# Patient Record
Sex: Male | Born: 1960 | Race: Black or African American | Hispanic: No | Marital: Single | Smoking: Never smoker
Health system: Southern US, Community
[De-identification: ages and names within clinical notes are randomized; demographics above are authoritative.]

## PROBLEM LIST (undated history)

## (undated) ENCOUNTER — Emergency Department (HOSPITAL_COMMUNITY): Discharge status: Absent without leave | Payer: Self-pay

## (undated) DIAGNOSIS — E785 Hyperlipidemia, unspecified: Secondary | ICD-10-CM

## (undated) DIAGNOSIS — I10 Essential (primary) hypertension: Secondary | ICD-10-CM

## (undated) HISTORY — DX: Hyperlipidemia, unspecified: E78.5

---

## 1978-07-06 HISTORY — PX: NASAL SINUS SURGERY: SHX719

## 2006-12-09 ENCOUNTER — Encounter: Admission: RE | Admit: 2006-12-09 | Discharge: 2006-12-09 | Payer: Self-pay | Admitting: Internal Medicine

## 2006-12-09 ENCOUNTER — Ambulatory Visit: Payer: Self-pay | Admitting: *Deleted

## 2006-12-17 ENCOUNTER — Encounter: Admission: RE | Admit: 2006-12-17 | Discharge: 2006-12-17 | Payer: Self-pay | Admitting: Internal Medicine

## 2008-08-22 IMAGING — CT CT HEAD W/O CM
1 series · 15 of 28 positions shown, 19 images · IV contrast (agent unspecified)
Comparison: None.

CLINICAL DATA: Episode of left arm numbness three weeks ago.  Possible stroke.  
 HEAD CT WITHOUT CONTRAST:
TECHNIQUE: Contiguous axial images were obtained from the base of the skull through the vertex according to standard protocol without contrast.

[Series 32: 3d filtered head · axial · 0.49mm/px · z∈[+49,+175]mm · 15 of 28 slices shown, 19 images]
[im 2/28  brain]
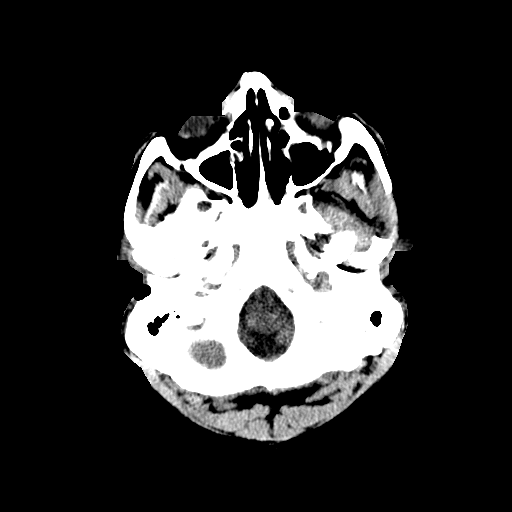
[im 2/28  bone]
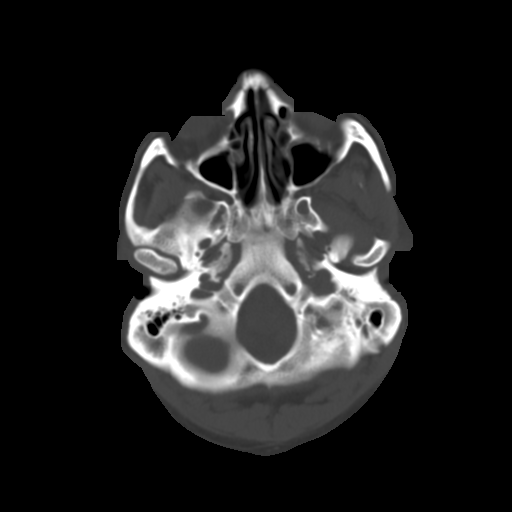
[im 4/28  brain]
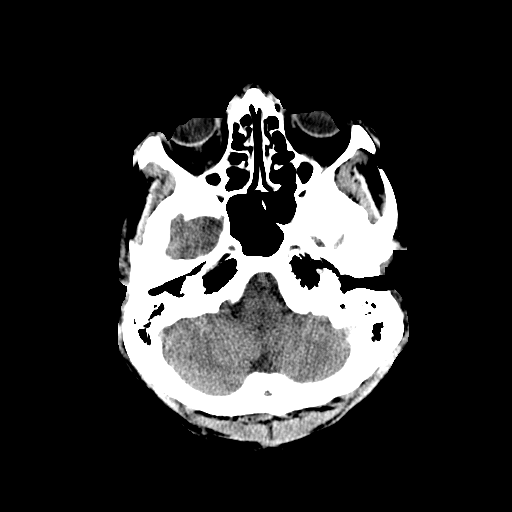
[im 6/28  brain]
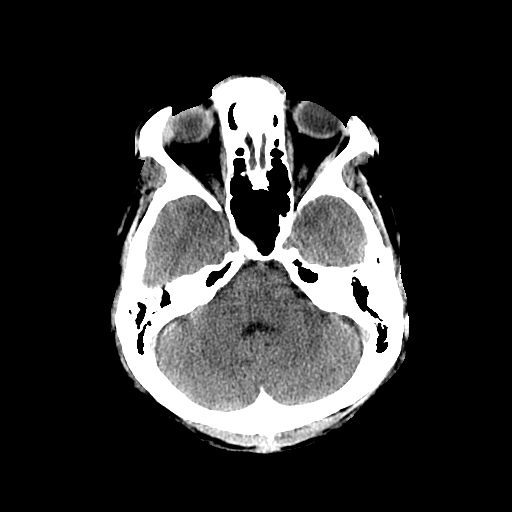
[im 8/28  brain]
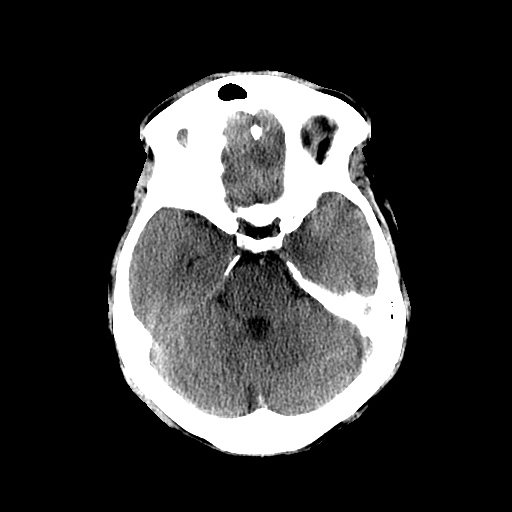
[im 9/28  brain]
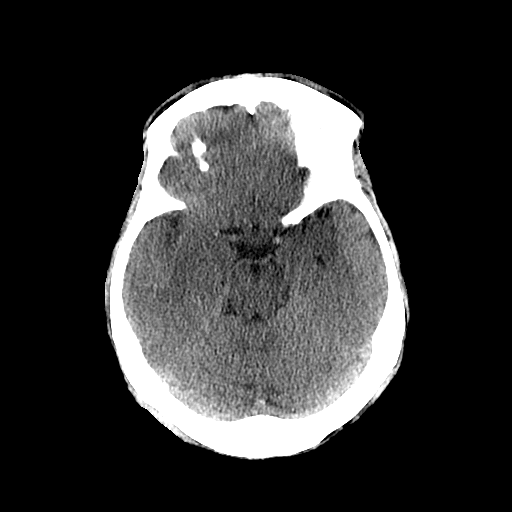
[im 9/28  bone]
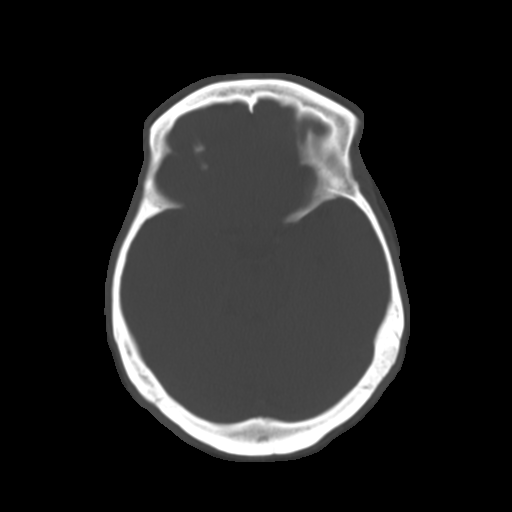
[im 11/28  brain]
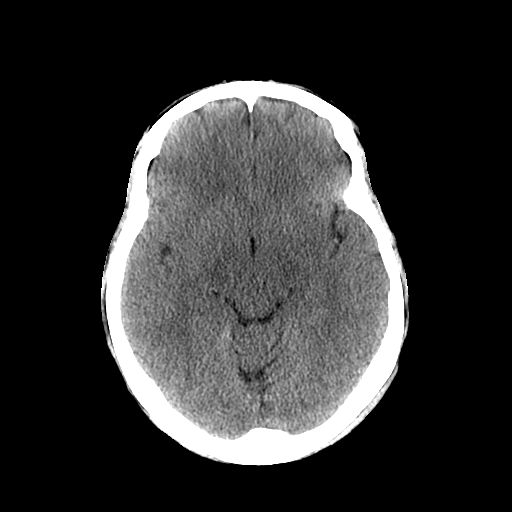
[im 13/28  brain]
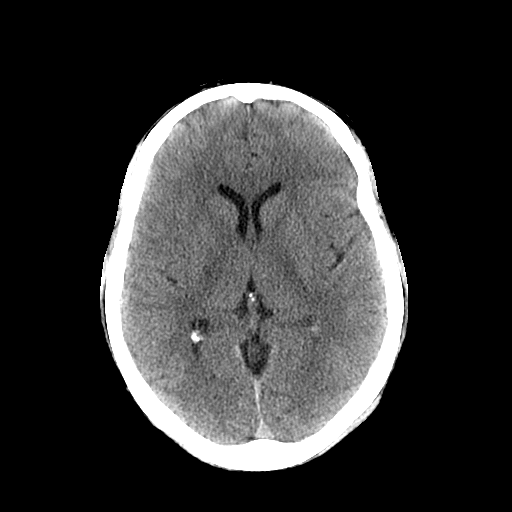
[im 15/28  brain]
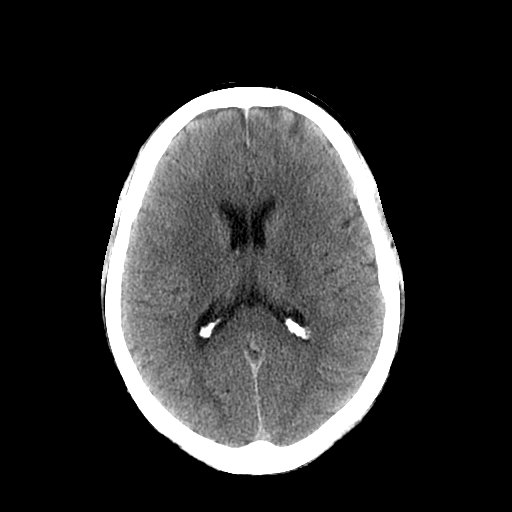
[im 16/28  brain]
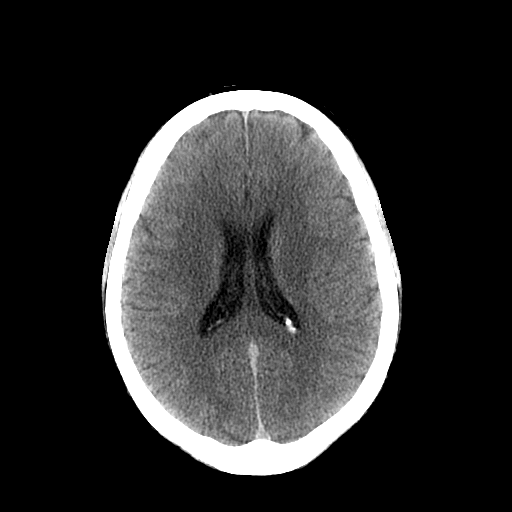
[im 16/28  bone]
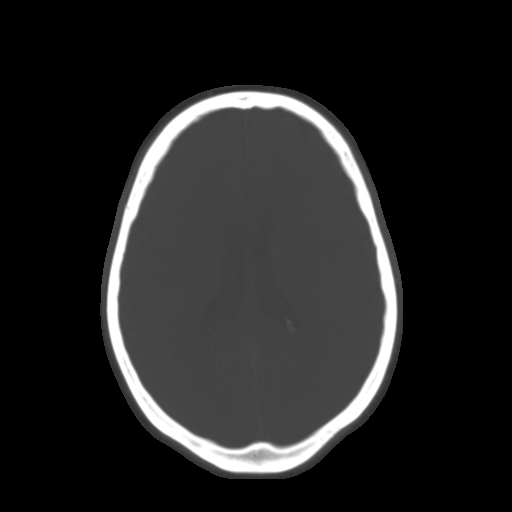
[im 18/28  brain]
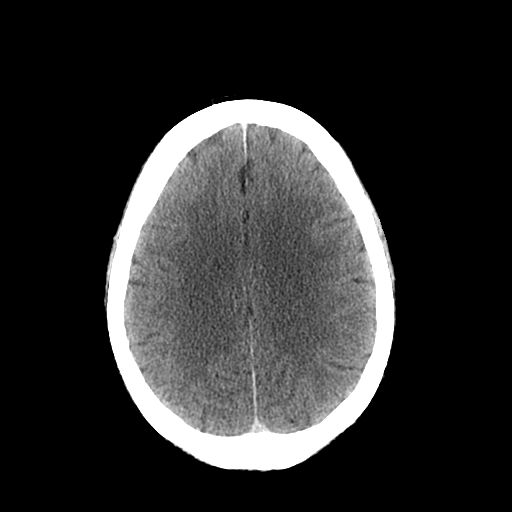
[im 20/28  brain]
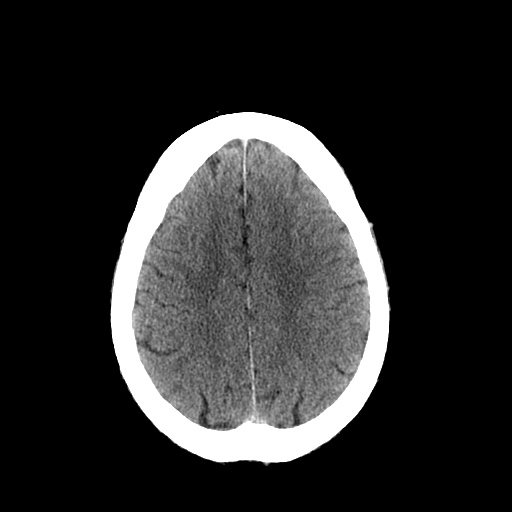
[im 21/28  brain]
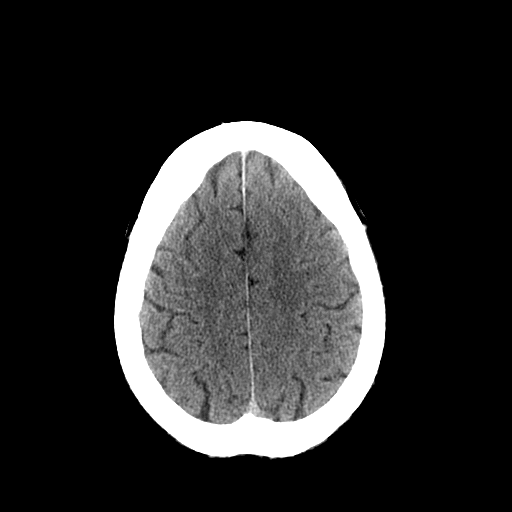
[im 23/28  brain]
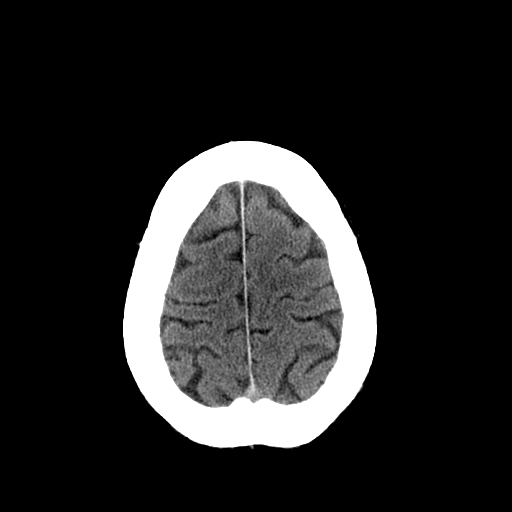
[im 23/28  bone]
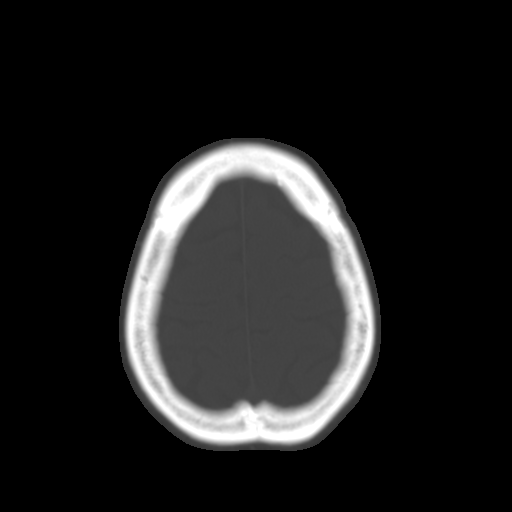
[im 25/28  brain]
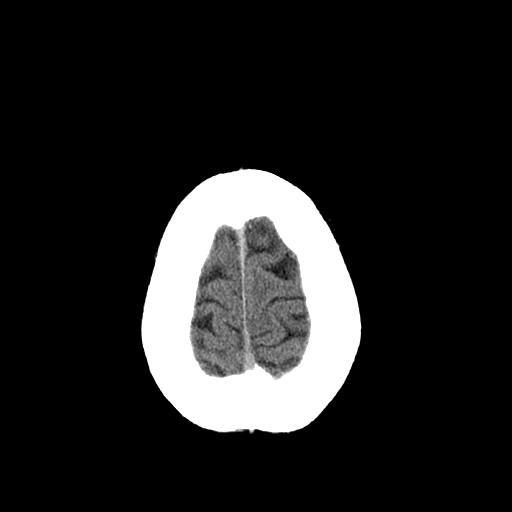
[im 27/28  brain]
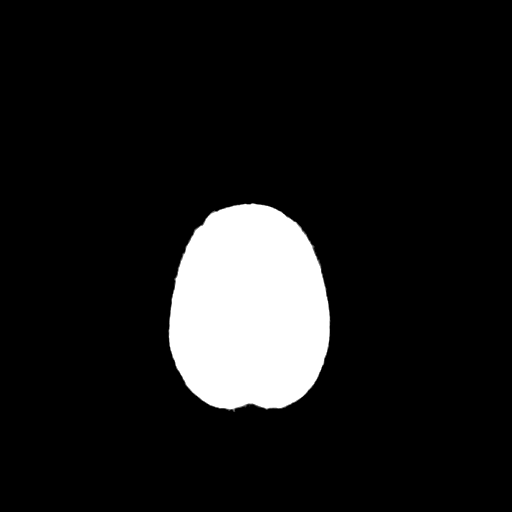

[15 of 28 positions shown; findings below may reference images not displayed]

FINDINGS: There is no evidence of acute intracranial hemorrhage, mass effect, or extraaxial fluid collection.  The ventricles and subarachnoid spaces remain appropriately sized for age.  There is low density in the region of the posterior limb of the internal capsule on the right (image 13) which could reflect a subacute lacunar stroke.  No cortical stroke is demonstrated.  Low density anteriorly in the left aspect of the pons on image 8 is probably artifactual as the pons appears normal on the adjacent images.  There is mild mucosal thickening in the right maxillary and both ethmoid sinuses. The calvarium is intact.
IMPRESSION: 1.  No evidence of acute hemorrhage or cortical stroke.
 2.  Possible small subacute lacunar stroke in the posterior limb of the right internal capsule.  This would be better evaluated by MRI if clinically warranted.

## 2011-08-27 ENCOUNTER — Encounter (HOSPITAL_COMMUNITY): Payer: Self-pay | Admitting: Emergency Medicine

## 2011-08-27 ENCOUNTER — Emergency Department (INDEPENDENT_AMBULATORY_CARE_PROVIDER_SITE_OTHER)
Admission: EM | Admit: 2011-08-27 | Discharge: 2011-08-27 | Disposition: A | Discharge status: Home Health | Payer: Self-pay | Source: Home / Self Care | Attending: Emergency Medicine | Admitting: Emergency Medicine

## 2011-08-27 DIAGNOSIS — L03019 Cellulitis of unspecified finger: Secondary | ICD-10-CM

## 2011-08-27 DIAGNOSIS — Z23 Encounter for immunization: Secondary | ICD-10-CM

## 2011-08-27 DIAGNOSIS — L03012 Cellulitis of left finger: Secondary | ICD-10-CM

## 2011-08-27 HISTORY — DX: Essential (primary) hypertension: I10

## 2011-08-27 MED ORDER — CEPHALEXIN 500 MG PO CAPS: 500.0000 mg | ORAL_CAPSULE | Freq: Three times a day (TID) | ORAL | Status: AC

## 2011-08-27 MED ORDER — TETANUS-DIPHTH-ACELL PERTUSSIS 5-2.5-18.5 LF-MCG/0.5 IM SUSP
INTRAMUSCULAR | Status: AC
  Filled 2011-08-27: qty 0.5

## 2011-08-27 MED ORDER — TETANUS-DIPHTH-ACELL PERTUSSIS 5-2.5-18.5 LF-MCG/0.5 IM SUSP
0.5000 mL | Freq: Once | INTRAMUSCULAR | Status: AC
  Administered 2011-08-27: 0.5 mL via INTRAMUSCULAR

## 2011-08-27 NOTE — Discharge Instructions (Signed)
Paronychia Paronychia is an inflammatory reaction involving the folds of the skin surrounding the fingernail. This is commonly caused by an infection in the skin around a nail. The most common cause of paronychia is frequent wetting of the hands (as seen with bartenders, food servers, nurses or others who wet their hands). This makes the skin around the fingernail susceptible to infection by bacteria (germs) or fungus. Other predisposing factors are:  Aggressive manicuring.   Nail biting.   Thumb sucking.  The most common cause is a staphylococcal (a type of germ) infection, or a fungal (Candida) infection. When caused by a germ, it usually comes on suddenly with redness, swelling, pus and is often painful. It may get under the nail and form an abscess (collection of pus), or form an abscess around the nail. If the nail itself is infected with a fungus, the treatment is usually prolonged and may require oral medicine for up to one year. Your caregiver will determine the length of time treatment is required. The paronychia caused by bacteria (germs) may largely be avoided by not pulling on hangnails or picking at cuticles. When the infection occurs at the tips of the finger it is called felon. When the cause of paronychia is from the herpes simplex virus (HSV) it is called herpetic whitlow. TREATMENT  When an abscess is present treatment is often incision and drainage. This means that the abscess must be cut open so the pus can get out. When this is done, the following home care instructions should be followed. HOME CARE INSTRUCTIONS   It is important to keep the affected fingers very dry. Rubber or plastic gloves over cotton gloves should be used whenever the hand must be placed in water.   Keep wound clean, dry and dressed as suggested by your caregiver between warm soaks or warm compresses.   Soak in warm water for fifteen to twenty minutes three to four times per day for bacterial infections.  Fungal infections are very difficult to treat, so often require treatment for long periods of time.   For bacterial (germ) infections take antibiotics (medicine which kill germs) as directed and finish the prescription, even if the problem appears to be solved before the medicine is gone.   Only take over-the-counter or prescription medicines for pain, discomfort, or fever as directed by your caregiver.  SEEK IMMEDIATE MEDICAL CARE IF:  You have redness, swelling, or increasing pain in the wound.   You notice pus coming from the wound.   You have a fever.   You notice a bad smell coming from the wound or dressing.  Document Released: 12/16/2000 Document Revised: 03/04/2011 Document Reviewed: 08/17/2008 [employer] Patient Information 2012 [employer]. 

## 2011-08-27 NOTE — ED Notes (Signed)
HERE WITH LEFT HAND THUMB THROB PAIN X 3 DYS AGO S/P INJURY X 1WEEK AGO WHILE MOVING SOMETHING.PT STATES STAPLE WENT DOWN IN TIP OF THUMB BUT ABLE TO PULL IT OUT.MIN SWELLING  SEEN BUT BENDING AND MOVING WITHOUT DIFF.PT DON'T REMEMBER LAST TETANUS

## 2011-08-27 NOTE — ED Provider Notes (Signed)
Chief Complaint  Patient presents with  . Finger Injury    History of Present Illness:   About a week ago, Andre Salazar stuck a staple under his left thumbnail. This was a little bit painful at first but then it got better, however over the last several days the tip of the thumb has been painful, throbbing, and tender to touch. There's been no purulent drainage. He is able to move all his joints well and without any difficulty. He cannot recall when his last tetanus vaccine was.  Review of Systems:  Other than noted above, the patient denies any of the following symptoms: Systemic:  No fevers, chills, sweats, or aches.  No fatigue or tiredness. Musculoskeletal:  No joint pain, arthritis, bursitis, swelling, back pain, or neck pain. Neurological:  No muscular weakness, paresthesias, headache, or trouble with speech or coordination.  No dizziness.   PMFSH:  Past medical history, family history, social history, meds, and allergies were reviewed.  Physical Exam:   Vital signs:  BP 132/82  Pulse 87  Temp(Src) 99 F (37.2 C) (Oral)  Resp 24  SpO2 98% Gen:  Alert and oriented times 3.  In no distress. Musculoskeletal: He has a paronychia with swelling, tenderness to touch, and the visible pus collection under the nail fold of the left thumb medially. Otherwise, all joints had a full a ROM with no swelling, bruising or deformity.  No edema, pulses full. Extremities were warm and pink.  Capillary refill was brisk.  Skin:  Clear, warm and dry.  No rash. Neuro:  Alert and oriented times 3.  Muscle strength was normal.  Sensation was intact to light touch.   Procedure Note:  Verbal informed consent was obtained from the patient.  Risks and benefits were outlined with the patient.  Patient understands and accepts these risks.  Identity of the patient was confirmed verbally and by armband.    Procedure was performed as followed:  The area of the paronychia was prepped with alcohol and a small  incision was made into the collection of pus yielding several drops of pus. This was cultured, all the pus was drained out and a pressure dressing was applied. The patient was instructed in wound care and should return again if needed.  Patient tolerated the procedure well without any immediate complications.   Assessment:   Diagnoses that have been ruled out:  None  Diagnoses that are still under consideration:  None  Final diagnoses:  Paronychia of left thumb    Plan:   1.  The following meds were prescribed:   New Prescriptions   CEPHALEXIN (KEFLEX) 500 MG CAPSULE    Take 1 capsule (500 mg total) by mouth 3 (three) times daily.   2.  The patient was instructed in symptomatic care, including rest and activity, elevation, application of ice and compression.  Appropriate handouts were given. 3.  The patient was told to return if becoming worse in any way, if no better in 3 or 4 days, and given some red flag symptoms that would indicate earlier return.   4.  The patient was told to follow up only if needed, particularly if there is any evidence that the infection is progressing.   Roque Lias, MD 08/27/11 [phone]

## 2011-08-28 ENCOUNTER — Telehealth (HOSPITAL_COMMUNITY): Payer: Self-pay | Admitting: *Deleted

## 2011-08-28 NOTE — ED Notes (Signed)
Dr. Lorenz Coaster said he had e-prescribed it yesterday, but if they don't have it I can call it in. I called the [facility] pharmacy back and spoke to the tech. She said they had it filled " from 2 days ago." I told her to disregard my call in order today on their VM if it was already filled. I called pt. and told left him a message that the Rx. was filled and ready to be picked up. Vassie Moselle 08/28/2011

## 2011-08-28 NOTE — ED Notes (Signed)
[phone] Pt. called and said [facility] on [address] did not have his Rx. of Keflex. I called [facility]  [phone] but could not get a pharmacist or tech to answer. I called in Rx. to VM but said to disregard if Rx. has already been filled. Vassie Moselle 08/28/2011

## 2011-08-30 LAB — CULTURE, ROUTINE-ABSCESS: Gram Stain: NONE SEEN

## 2013-07-06 HISTORY — PX: COLONOSCOPY: SHX174

## 2013-07-11 ENCOUNTER — Other Ambulatory Visit: Payer: Self-pay | Admitting: Gastroenterology

## 2013-07-11 DIAGNOSIS — R7989 Other specified abnormal findings of blood chemistry: Secondary | ICD-10-CM

## 2013-07-11 DIAGNOSIS — R945 Abnormal results of liver function studies: Principal | ICD-10-CM

## 2014-01-03 ENCOUNTER — Other Ambulatory Visit: Payer: Self-pay | Admitting: Family Medicine

## 2014-01-03 ENCOUNTER — Ambulatory Visit (INDEPENDENT_AMBULATORY_CARE_PROVIDER_SITE_OTHER): Payer: Self-pay

## 2014-01-03 DIAGNOSIS — R9389 Abnormal findings on diagnostic imaging of other specified body structures: Secondary | ICD-10-CM

## 2014-01-03 DIAGNOSIS — R52 Pain, unspecified: Secondary | ICD-10-CM

## 2016-06-03 DIAGNOSIS — M791 Myalgia: Secondary | ICD-10-CM | POA: Diagnosis not present

## 2016-06-03 DIAGNOSIS — M25562 Pain in left knee: Secondary | ICD-10-CM | POA: Diagnosis not present

## 2016-06-03 DIAGNOSIS — M25552 Pain in left hip: Secondary | ICD-10-CM | POA: Diagnosis not present

## 2016-06-03 DIAGNOSIS — Z23 Encounter for immunization: Secondary | ICD-10-CM | POA: Diagnosis not present

## 2016-06-03 DIAGNOSIS — R1032 Left lower quadrant pain: Secondary | ICD-10-CM | POA: Diagnosis not present

## 2016-06-03 DIAGNOSIS — M25551 Pain in right hip: Secondary | ICD-10-CM | POA: Diagnosis not present

## 2016-06-03 DIAGNOSIS — M5136 Other intervertebral disc degeneration, lumbar region: Secondary | ICD-10-CM | POA: Diagnosis not present

## 2016-06-03 DIAGNOSIS — M179 Osteoarthritis of knee, unspecified: Secondary | ICD-10-CM | POA: Diagnosis not present

## 2016-06-04 DIAGNOSIS — M25562 Pain in left knee: Secondary | ICD-10-CM | POA: Diagnosis not present

## 2016-06-04 DIAGNOSIS — M1990 Unspecified osteoarthritis, unspecified site: Secondary | ICD-10-CM | POA: Diagnosis not present

## 2016-06-18 DIAGNOSIS — M25562 Pain in left knee: Secondary | ICD-10-CM | POA: Diagnosis not present

## 2016-06-18 DIAGNOSIS — M1990 Unspecified osteoarthritis, unspecified site: Secondary | ICD-10-CM | POA: Diagnosis not present

## 2016-07-14 DIAGNOSIS — M25552 Pain in left hip: Secondary | ICD-10-CM | POA: Diagnosis not present

## 2016-07-27 DIAGNOSIS — M1612 Unilateral primary osteoarthritis, left hip: Secondary | ICD-10-CM | POA: Diagnosis not present

## 2016-07-27 DIAGNOSIS — M5136 Other intervertebral disc degeneration, lumbar region: Secondary | ICD-10-CM | POA: Diagnosis not present

## 2016-09-28 DIAGNOSIS — M5136 Other intervertebral disc degeneration, lumbar region: Secondary | ICD-10-CM | POA: Diagnosis not present

## 2016-09-30 DIAGNOSIS — M5136 Other intervertebral disc degeneration, lumbar region: Secondary | ICD-10-CM | POA: Diagnosis not present

## 2016-10-07 DIAGNOSIS — M5136 Other intervertebral disc degeneration, lumbar region: Secondary | ICD-10-CM | POA: Diagnosis not present

## 2016-11-24 ENCOUNTER — Other Ambulatory Visit: Payer: Self-pay | Admitting: Internal Medicine

## 2016-11-24 ENCOUNTER — Ambulatory Visit
Admission: RE | Admit: 2016-11-24 | Discharge: 2016-11-24 | Disposition: A | Discharge status: Home / Self Care | Payer: BLUE CROSS/BLUE SHIELD | Source: Ambulatory Visit | Attending: Internal Medicine | Admitting: Internal Medicine

## 2016-11-24 DIAGNOSIS — R29810 Facial weakness: Secondary | ICD-10-CM

## 2017-04-08 DIAGNOSIS — Z0000 Encounter for general adult medical examination without abnormal findings: Secondary | ICD-10-CM | POA: Diagnosis not present

## 2017-04-08 DIAGNOSIS — I10 Essential (primary) hypertension: Secondary | ICD-10-CM | POA: Diagnosis not present

## 2017-04-15 DIAGNOSIS — R002 Palpitations: Secondary | ICD-10-CM | POA: Diagnosis not present

## 2017-04-15 DIAGNOSIS — E782 Mixed hyperlipidemia: Secondary | ICD-10-CM | POA: Diagnosis not present

## 2017-04-15 DIAGNOSIS — E6609 Other obesity due to excess calories: Secondary | ICD-10-CM | POA: Diagnosis not present

## 2017-04-15 DIAGNOSIS — I10 Essential (primary) hypertension: Secondary | ICD-10-CM | POA: Diagnosis not present

## 2017-04-15 DIAGNOSIS — Z0000 Encounter for general adult medical examination without abnormal findings: Secondary | ICD-10-CM | POA: Diagnosis not present

## 2017-04-28 DIAGNOSIS — M5432 Sciatica, left side: Secondary | ICD-10-CM | POA: Diagnosis not present

## 2017-04-28 DIAGNOSIS — M5136 Other intervertebral disc degeneration, lumbar region: Secondary | ICD-10-CM | POA: Diagnosis not present

## 2017-06-03 DIAGNOSIS — R945 Abnormal results of liver function studies: Secondary | ICD-10-CM | POA: Diagnosis not present

## 2017-06-03 DIAGNOSIS — E782 Mixed hyperlipidemia: Secondary | ICD-10-CM | POA: Diagnosis not present

## 2017-06-10 DIAGNOSIS — R7303 Prediabetes: Secondary | ICD-10-CM | POA: Diagnosis not present

## 2017-06-10 DIAGNOSIS — I10 Essential (primary) hypertension: Secondary | ICD-10-CM | POA: Diagnosis not present

## 2017-06-10 DIAGNOSIS — M5442 Lumbago with sciatica, left side: Secondary | ICD-10-CM | POA: Diagnosis not present

## 2017-06-10 DIAGNOSIS — M47816 Spondylosis without myelopathy or radiculopathy, lumbar region: Secondary | ICD-10-CM | POA: Diagnosis not present

## 2017-06-10 DIAGNOSIS — G4733 Obstructive sleep apnea (adult) (pediatric): Secondary | ICD-10-CM | POA: Diagnosis not present

## 2017-06-10 DIAGNOSIS — M25562 Pain in left knee: Secondary | ICD-10-CM | POA: Diagnosis not present

## 2017-06-15 ENCOUNTER — Other Ambulatory Visit: Payer: Self-pay | Admitting: Internal Medicine

## 2017-06-15 DIAGNOSIS — M5442 Lumbago with sciatica, left side: Secondary | ICD-10-CM

## 2017-06-18 DIAGNOSIS — M1712 Unilateral primary osteoarthritis, left knee: Secondary | ICD-10-CM | POA: Diagnosis not present

## 2017-06-22 ENCOUNTER — Encounter: Payer: Self-pay | Admitting: Neurology

## 2017-06-23 ENCOUNTER — Encounter: Payer: Self-pay | Admitting: Neurology

## 2017-06-23 ENCOUNTER — Encounter (INDEPENDENT_AMBULATORY_CARE_PROVIDER_SITE_OTHER): Payer: Self-pay

## 2017-06-23 ENCOUNTER — Ambulatory Visit: Payer: BLUE CROSS/BLUE SHIELD | Admitting: Neurology

## 2017-06-23 VITALS — BP 139/79 | HR 75 | Ht 71.0 in | Wt 251.0 lb

## 2017-06-23 DIAGNOSIS — R0683 Snoring: Secondary | ICD-10-CM

## 2017-06-23 DIAGNOSIS — G4730 Sleep apnea, unspecified: Secondary | ICD-10-CM | POA: Diagnosis not present

## 2017-06-23 DIAGNOSIS — I10 Essential (primary) hypertension: Secondary | ICD-10-CM | POA: Diagnosis not present

## 2017-06-23 DIAGNOSIS — G4710 Hypersomnia, unspecified: Secondary | ICD-10-CM | POA: Diagnosis not present

## 2017-06-23 NOTE — Progress Notes (Signed)
SLEEP MEDICINE CLINIC   Provider:  Melvyn Novasarmen  [place], [place]  Primary Care Physician:  Georgianne Fickamachandran, Ajith, MD   Referring Provider: Georgianne Fickamachandran, Ajith, MD   Chief Complaint  Patient presents with  . New Patient (Initial Visit)    pt alone, rm 10. pt has been told he stops breathing and snoring.. he had operation for a deviated septum  in past. pt has also been tried during the day.     HPI:  Andre Salazar is a 56 y.o. male , seen here  in a referral  from Dr. Nicholos Johnsamachandran for evaluation of sleep apnea.   Andre Salazar is a 56 year old male patient, left-handed, and has felt more and more tired and fatigued during the day.  He reports that he struggles with sleepiness as well especially when he is physically inactive, in a meeting for example.  As long as he is mentally stimulated and physically active his sleepiness is controlled.   His fiance has shared with him that he snores and she has witnessed him to stop breathing at night.  Over 20 years ago he had a septoplasty which allows him to keep the nasal passage patent. He stills is often congested.  He thinks that he was even 20 years ago snoring but at the time he was mostly a mouth breather prior to his nasal surgery. The patient has made a conscious effort to lose weight and reduce his body weight by about 12 pounds over the last half year.  He had gained weight after being physically an active following an Achilles tendon surgery.  Sleep habits are as follows: The patient states that his days are unpredictable, he starts usually winding down around 10 PM but that still puts his bedtime towards midnight.  He has no trouble initiating sleep, he describes his bedroom as cool, quiet and dark.  Prefers to sleep on his side, multiple pillows. He will have one bathroom break on average at night, sometimes two but can go back to sleep after each. He does not recall dreaming, rises spontaneously between 7.30 and 8 AM.  When he wakes up  he does not feel headaches, dizziness, diaphoresis or palpitations.  He he feels that he is not fully rested and refreshed.He usually does not take daytime naps.   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  Irregular work hours not night shift work. The patient is engaged, his fiance has noted an increase in snoring and apnea over the last years. 2 adult sons, non smoker all his life, ETOH - socially, wine or beer. 4 a week.  Caffeine, none.   Review of Systems: Out of a complete 14 system review, the patient complains of only the following symptoms, and all other reviewed systems are negative.  How likely are you to doze in the following situations: 0 = not likely, 1 = slight chance, 2 = moderate chance, 3 = high chance  Sitting and Reading? 2 Watching Television?  2 Sitting inactive in a public place (theater or meeting)? 1 Lying down in the afternoon when circumstances permit?3 Sitting and talking to someone?3 Sitting quietly after lunch without alcohol? 1In a car, while stopped for a few minutes in traffic?2 As a passenger in a car for an hour without a break? 1  Total = 15  Epworth score  15 , Fatigue severity score 37  , depression score n/a    Social History  Socioeconomic History  . Marital status: Married    Spouse name: Not on file  . Number of children: Not on file  . Years of education: Not on file  . Highest education level: Not on file  Social Needs  . Financial resource strain: Not on file  . Food insecurity - worry: Not on file  . Food insecurity - inability: Not on file  . Transportation needs - medical: Not on file  . Transportation needs - non-medical: Not on file  Occupational History  . Not on file  Tobacco Use  . Smoking status: Never Smoker  . Smokeless tobacco: Never Used  Substance and Sexual Activity  . Alcohol use: Yes  . Drug use: No  . Sexual activity: Not on  file  Other Topics Concern  . Not on file  Social History Narrative  . Not on file    Family History  Problem Relation Age of Onset  . Hypertension Other   . Cirrhosis Father   . Alcohol abuse Father     Past Medical History:  Diagnosis Date  . Hyperlipidemia   . Hypertension     Past Surgical History:  Procedure Laterality Date  . COLONOSCOPY  2015  . NASAL SINUS SURGERY  1980    Current Outpatient Medications  Medication Sig Dispense Refill  . cholecalciferol (VITAMIN D) 1000 units tablet Take 1,000 Units by mouth daily.    . Cyanocobalamin (VITAMIN B 12) 100 MCG LOZG Take 1 lozenge by mouth daily.    . fluticasone (FLONASE) 50 MCG/ACT nasal spray Place into both nostrils daily.    . Multiple Vitamin (MULTIVITAMIN) tablet Take 1 tablet by mouth daily.    Marland Kitchen. triamterene-hydrochlorothiazide (DYAZIDE) 37.5-25 MG capsule Take 1 capsule by mouth daily.     No current facility-administered medications for this visit.     Allergies as of 06/23/2017 - Review Complete 06/23/2017  Allergen Reaction Noted  . Iodine  06/22/2017    Vitals: BP 139/79   Pulse 75   Ht 5\' 11"  (1.803 m)   Wt 251 lb (113.9 kg)   BMI 35.01 kg/m  Last Weight:  Wt Readings from Last 1 Encounters:  06/23/17 251 lb (113.9 kg)   JXB:JYNWBMI:Body mass index is 35.01 kg/m.     Last Height:   Ht Readings from Last 1 Encounters:  06/23/17 5\' 11"  (1.803 m)    Physical exam: General: The patient is awake, alert and appears not in acute distress. The patient is well groomed. Head: Normocephalic, atraumatic. Neck is supple. Mallampati 5 ,  neck circumference:18. 5 ". Nasal airflow congested , TMJ is evident . Retrognathia is seen.  Cardiovascular:  Regular rate and rhythm - without  murmurs or carotid bruit, and without distended neck veins. Respiratory: Lungs are clear to auscultation. Skin:  Without evidence of edema, or rash Trunk: BMI is 35. The patient's posture is   Neurologic exam : The patient is  awake and alert, oriented to place and time.  Attention span & concentration ability appears normal.  Speech is fluent,  without dysarthria, dysphonia or aphasia.  Mood and affect are appropriate.  Cranial nerves: Pupils are equal and briskly reactive to light. Funduscopic exam without evidence of pallor or edema. Extraocular movements  in vertical and horizontal planes intact and without nystagmus. Visual fields by finger perimetry are intact. Hearing to finger rub intact.  Facial sensation intact to fine touch. Facial motor strength is symmetric and tongue and uvula move midline. Shoulder shrug was  symmetrical.   Motor exam:  Normal tone, muscle bulk and symmetric strength in all extremities. Sensory:  Fine touch, pinprick and vibration were tested in all extremities. Proprioception tested in the upper extremities was normal. Coordination: Rapid alternating movements/Finger-to-nose maneuver normal without evidence of ataxia, dysmetria or tremor. Gait and station: Patient walks without assistive device . Strength within normal limits.  Stance is stable and normal.  Deep tendon reflexes: in the upper and lower extremities are symmetric and intact.  Babinski maneuver response is downgoing.  Assessment:  After physical and neurologic examination, review of laboratory studies,  Personal review of imaging studies, reports of other /same  Imaging studies, results of polysomnography and / or neurophysiology testing and pre-existing records as far as provided in visit., my assessment is   1) Andre Salazar has some risk factors for sleep apnea and of course that has been the observation that he snores and is apneic at night.  His risk factors include a body mass index that straddles 35, a larger than average neck circumference, mild red prognathia and a high-grade Mallampati.  Helpful should be that he had his septal surgery and that his nasal passages patent, that he is in the process of losing weight,  and that he apparently has not developed nocturia which is common in patients with severe sleep apnea. My goal is to evaluate him by a home sleep test, we can then treat apnea according to the results of this test either with a dental device, with CPAP if necessary, or in case of very mild apnea, just continue the weight loss therapy.  The patient was advised of the nature of the diagnosed disorder , the treatment options and the  risks for general health and wellness arising from not treating the condition.   I spent more than 35  minutes of face to face time with the patient.  Greater than 50% of time was spent in counseling and coordination of care. We have discussed the diagnosis and differential and I answered the patient's questions.    Plan:  Treatment plan and additional workup :  Watch pat HST and follow with auto titration if indicated. RV after HST.      No Follow-up on file.   Melvyn Novas, MD 06/23/2017, 9:20 AM  Certified in Neurology by ABPN Certified in Sleep Medicine by [facility] [address], [address] [place], [place] [address]

## 2017-06-25 ENCOUNTER — Ambulatory Visit
Admission: RE | Admit: 2017-06-25 | Discharge: 2017-06-25 | Disposition: A | Discharge status: Home / Self Care | Payer: BLUE CROSS/BLUE SHIELD | Source: Ambulatory Visit | Attending: Internal Medicine | Admitting: Internal Medicine

## 2017-06-25 DIAGNOSIS — M48061 Spinal stenosis, lumbar region without neurogenic claudication: Secondary | ICD-10-CM | POA: Diagnosis not present

## 2017-06-25 DIAGNOSIS — M5442 Lumbago with sciatica, left side: Secondary | ICD-10-CM

## 2017-07-14 ENCOUNTER — Ambulatory Visit (INDEPENDENT_AMBULATORY_CARE_PROVIDER_SITE_OTHER): Payer: BLUE CROSS/BLUE SHIELD | Admitting: Neurology

## 2017-07-14 DIAGNOSIS — R0683 Snoring: Secondary | ICD-10-CM

## 2017-07-14 DIAGNOSIS — G4730 Sleep apnea, unspecified: Principal | ICD-10-CM

## 2017-07-14 DIAGNOSIS — I10 Essential (primary) hypertension: Secondary | ICD-10-CM

## 2017-07-14 DIAGNOSIS — G4710 Hypersomnia, unspecified: Secondary | ICD-10-CM

## 2017-07-19 ENCOUNTER — Telehealth: Payer: Self-pay | Admitting: Neurology

## 2017-07-19 ENCOUNTER — Other Ambulatory Visit: Payer: Self-pay | Admitting: Neurology

## 2017-07-19 DIAGNOSIS — G4734 Idiopathic sleep related nonobstructive alveolar hypoventilation: Secondary | ICD-10-CM

## 2017-07-19 DIAGNOSIS — G4733 Obstructive sleep apnea (adult) (pediatric): Secondary | ICD-10-CM

## 2017-07-19 DIAGNOSIS — R0683 Snoring: Secondary | ICD-10-CM

## 2017-07-19 NOTE — Procedures (Signed)
[facility] [address] [place], [place] [id number] NAME:  Johnston EbbsStanley T. Nuccio                                                       DOB: [birth year] MEDICAL RECORD No:  [id number]                                                 DOS: 07/15/2017 REFERRING PHYSICIAN: Georgianne FickAjith Ramachandran, M.D. STUDY PERFORMED: Home Sleep Test on apnea link   HISTORY:  Duffy RhodyStanley T. Ericka PontiffMontgomery is a 57 year old male patient, left-handed, and has felt more and more tired and fatigued during the day.  He reports that he struggles with sleepiness when he is physically inactive, in a meeting, for example.  As long as he is mentally stimulated and physically active his sleepiness is controlled. His fiance has shared with him that he snores and she has witnessed him to stop breathing at night.  Over 20 years ago he had a septoplasty to keep the nasal passage patent. He stills is often congested.  He believes he was snoring 20 years ago, as he was mostly a mouth breather prior to his nasal surgery. The patient has made a conscious effort to lose weight and reduce his body weight by about 12 pounds over the last half year. He had gained weight after being physically an active following an Achilles tendon surgery. Epworth Sleepiness Score endorsed at 15 points, Fatigue severity score at 37. BMI was 35.0     STUDY RESULTS: Total Recording Time: 11 hours, 15 minutes Total Apnea/Hypopnea Index (AHI): 13.5 /hr. RDI was 16.2/hr.  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  Moderate to severe snoring (see RDI)  Prolonged hypoxemia Tachy -bradycardia in response to apnea.  RECOMMENDATION: I recommend CPAP treatment. Auto-titration CPAP at 5-15 cm water pressure, 3 cm expiratory pressure relief (EPR) and  heated humidity, under use of an interface that is comfortable for a patient with nasal congestion.    I certify that I have reviewed the raw data recording prior to the issuance of this report in accordance with the standards of the American Academy of Sleep Medicine (AASM).  Melvyn Novasarmen Catherine Cubero, M.D.      07-19-2017  Medical Director of [facility], Diplomat of the ABPN and ABSM, and accredited by the AASM

## 2017-07-19 NOTE — Telephone Encounter (Signed)
-----   Message from Melvyn Novasarmen Dohmeier, MD sent at 07/19/2017 10:04 AM EST ----- IMPRESSION:  Mild sleep apnea at AHI 13.5 , 81% obstructive, with few central  apneas.  Moderate to severe snoring (see RDI at 16.2)  Prolonged hypoxemia ( 66 minutes)  Tachy -bradycardia in response to apnea.  RECOMMENDATION: I recommend CPAP treatment. Auto-titration CPAP  at 5-15 cm water pressure, 3 cm expiratory pressure relief (EPR)  and heated humidity, under use of an interface that is  comfortable for a patient with nasal congestion.

## 2017-07-19 NOTE — Telephone Encounter (Signed)
I called pt. I advised pt that Dr. Vickey Hugerohmeier reviewed their sleep study results and found that pt has sleep apnea. Dr. Vickey Hugerohmeier recommends that pt starts CPAP. I reviewed PAP compliance expectations with the pt. Pt is agreeable to starting a CPAP. I advised pt that an order will be sent to a DME, [facility], and [facility] will call the pt within about one week after they file with the pt's insurance. [facility] will show the pt how to use the machine, fit for masks, and troubleshoot the CPAP if needed. A follow up appt was made for insurance purposes with Dr. Vickey Hugerohmeier on October 11, 2017 at 1:30 pm. Pt verbalized understanding to arrive 15 minutes early and bring their CPAP. A letter with all of this information in it will be mailed to the pt as a reminder. I verified with the pt that the address we have on file is correct. Pt verbalized understanding of results. Pt had no questions at this time but was encouraged to call back if questions arise.

## 2017-07-21 DIAGNOSIS — M25562 Pain in left knee: Secondary | ICD-10-CM | POA: Diagnosis not present

## 2017-07-21 DIAGNOSIS — M1712 Unilateral primary osteoarthritis, left knee: Secondary | ICD-10-CM | POA: Diagnosis not present

## 2017-07-23 DIAGNOSIS — M5417 Radiculopathy, lumbosacral region: Secondary | ICD-10-CM | POA: Diagnosis not present

## 2017-08-03 DIAGNOSIS — M5416 Radiculopathy, lumbar region: Secondary | ICD-10-CM | POA: Insufficient documentation

## 2017-08-10 DIAGNOSIS — M5416 Radiculopathy, lumbar region: Secondary | ICD-10-CM | POA: Diagnosis not present

## 2017-08-27 DIAGNOSIS — H109 Unspecified conjunctivitis: Secondary | ICD-10-CM | POA: Diagnosis not present

## 2017-09-24 DIAGNOSIS — G4733 Obstructive sleep apnea (adult) (pediatric): Secondary | ICD-10-CM | POA: Diagnosis not present

## 2017-10-11 ENCOUNTER — Ambulatory Visit: Payer: Self-pay | Admitting: Neurology

## 2017-10-14 DIAGNOSIS — M5442 Lumbago with sciatica, left side: Secondary | ICD-10-CM | POA: Diagnosis not present

## 2017-10-14 DIAGNOSIS — E782 Mixed hyperlipidemia: Secondary | ICD-10-CM | POA: Diagnosis not present

## 2017-10-14 DIAGNOSIS — R7303 Prediabetes: Secondary | ICD-10-CM | POA: Diagnosis not present

## 2017-10-21 DIAGNOSIS — G4733 Obstructive sleep apnea (adult) (pediatric): Secondary | ICD-10-CM | POA: Diagnosis not present

## 2017-10-21 DIAGNOSIS — I10 Essential (primary) hypertension: Secondary | ICD-10-CM | POA: Diagnosis not present

## 2017-10-21 DIAGNOSIS — R7303 Prediabetes: Secondary | ICD-10-CM | POA: Diagnosis not present

## 2017-10-21 DIAGNOSIS — E782 Mixed hyperlipidemia: Secondary | ICD-10-CM | POA: Diagnosis not present

## 2017-10-22 ENCOUNTER — Other Ambulatory Visit: Payer: Self-pay | Admitting: Internal Medicine

## 2017-10-22 DIAGNOSIS — R7989 Other specified abnormal findings of blood chemistry: Secondary | ICD-10-CM

## 2017-10-22 DIAGNOSIS — R945 Abnormal results of liver function studies: Secondary | ICD-10-CM

## 2017-10-25 DIAGNOSIS — G4733 Obstructive sleep apnea (adult) (pediatric): Secondary | ICD-10-CM | POA: Diagnosis not present

## 2017-11-02 ENCOUNTER — Other Ambulatory Visit: Payer: Self-pay

## 2017-11-02 ENCOUNTER — Telehealth: Payer: Self-pay | Admitting: Neurology

## 2017-11-02 ENCOUNTER — Ambulatory Visit: Payer: Self-pay | Admitting: Neurology

## 2017-11-02 ENCOUNTER — Encounter: Payer: Self-pay | Admitting: Neurology

## 2017-11-02 NOTE — Telephone Encounter (Signed)
Pt called the same day as apt to cancel.

## 2017-11-03 ENCOUNTER — Ambulatory Visit
Admission: RE | Admit: 2017-11-03 | Discharge: 2017-11-03 | Disposition: A | Discharge status: Home / Self Care | Payer: BLUE CROSS/BLUE SHIELD | Source: Ambulatory Visit | Attending: Internal Medicine | Admitting: Internal Medicine

## 2017-11-03 DIAGNOSIS — R7989 Other specified abnormal findings of blood chemistry: Secondary | ICD-10-CM

## 2017-11-03 DIAGNOSIS — R945 Abnormal results of liver function studies: Secondary | ICD-10-CM

## 2017-11-24 DIAGNOSIS — G4733 Obstructive sleep apnea (adult) (pediatric): Secondary | ICD-10-CM | POA: Diagnosis not present

## 2017-12-14 DIAGNOSIS — M25562 Pain in left knee: Secondary | ICD-10-CM | POA: Diagnosis not present

## 2017-12-14 DIAGNOSIS — M79672 Pain in left foot: Secondary | ICD-10-CM | POA: Diagnosis not present

## 2017-12-25 DIAGNOSIS — G4733 Obstructive sleep apnea (adult) (pediatric): Secondary | ICD-10-CM | POA: Diagnosis not present

## 2018-01-17 DIAGNOSIS — M79672 Pain in left foot: Secondary | ICD-10-CM | POA: Diagnosis not present

## 2018-01-17 DIAGNOSIS — M25562 Pain in left knee: Secondary | ICD-10-CM | POA: Diagnosis not present

## 2018-01-21 DIAGNOSIS — R7303 Prediabetes: Secondary | ICD-10-CM | POA: Diagnosis not present

## 2018-01-21 DIAGNOSIS — I10 Essential (primary) hypertension: Secondary | ICD-10-CM | POA: Diagnosis not present

## 2018-01-21 DIAGNOSIS — E782 Mixed hyperlipidemia: Secondary | ICD-10-CM | POA: Diagnosis not present

## 2018-01-24 DIAGNOSIS — G4733 Obstructive sleep apnea (adult) (pediatric): Secondary | ICD-10-CM | POA: Diagnosis not present

## 2018-01-27 DIAGNOSIS — E782 Mixed hyperlipidemia: Secondary | ICD-10-CM | POA: Diagnosis not present

## 2018-01-27 DIAGNOSIS — I10 Essential (primary) hypertension: Secondary | ICD-10-CM | POA: Diagnosis not present

## 2018-01-27 DIAGNOSIS — R7303 Prediabetes: Secondary | ICD-10-CM | POA: Diagnosis not present

## 2018-01-31 DIAGNOSIS — M25562 Pain in left knee: Secondary | ICD-10-CM | POA: Diagnosis not present

## 2018-02-07 DIAGNOSIS — M1712 Unilateral primary osteoarthritis, left knee: Secondary | ICD-10-CM | POA: Diagnosis not present

## 2018-02-24 DIAGNOSIS — G4733 Obstructive sleep apnea (adult) (pediatric): Secondary | ICD-10-CM | POA: Diagnosis not present

## 2018-03-27 DIAGNOSIS — G4733 Obstructive sleep apnea (adult) (pediatric): Secondary | ICD-10-CM | POA: Diagnosis not present

## 2018-04-17 DIAGNOSIS — H6122 Impacted cerumen, left ear: Secondary | ICD-10-CM | POA: Diagnosis not present

## 2018-04-17 DIAGNOSIS — Z23 Encounter for immunization: Secondary | ICD-10-CM | POA: Diagnosis not present

## 2018-04-17 DIAGNOSIS — H60502 Unspecified acute noninfective otitis externa, left ear: Secondary | ICD-10-CM | POA: Diagnosis not present

## 2018-04-17 DIAGNOSIS — I10 Essential (primary) hypertension: Secondary | ICD-10-CM | POA: Diagnosis not present

## 2018-04-26 DIAGNOSIS — G4733 Obstructive sleep apnea (adult) (pediatric): Secondary | ICD-10-CM | POA: Diagnosis not present

## 2018-05-10 DIAGNOSIS — I10 Essential (primary) hypertension: Secondary | ICD-10-CM | POA: Diagnosis not present

## 2018-05-10 DIAGNOSIS — E782 Mixed hyperlipidemia: Secondary | ICD-10-CM | POA: Diagnosis not present

## 2018-05-10 DIAGNOSIS — R200 Anesthesia of skin: Secondary | ICD-10-CM | POA: Diagnosis not present

## 2018-05-19 DIAGNOSIS — I10 Essential (primary) hypertension: Secondary | ICD-10-CM | POA: Diagnosis not present

## 2018-05-19 DIAGNOSIS — R200 Anesthesia of skin: Secondary | ICD-10-CM | POA: Diagnosis not present

## 2018-05-19 DIAGNOSIS — R42 Dizziness and giddiness: Secondary | ICD-10-CM | POA: Diagnosis not present

## 2018-05-19 DIAGNOSIS — R7303 Prediabetes: Secondary | ICD-10-CM | POA: Diagnosis not present

## 2018-05-19 DIAGNOSIS — E782 Mixed hyperlipidemia: Secondary | ICD-10-CM | POA: Diagnosis not present

## 2018-05-26 DIAGNOSIS — E782 Mixed hyperlipidemia: Secondary | ICD-10-CM | POA: Diagnosis not present

## 2018-05-26 DIAGNOSIS — Z0000 Encounter for general adult medical examination without abnormal findings: Secondary | ICD-10-CM | POA: Diagnosis not present

## 2018-05-26 DIAGNOSIS — I10 Essential (primary) hypertension: Secondary | ICD-10-CM | POA: Diagnosis not present

## 2018-05-27 DIAGNOSIS — G4733 Obstructive sleep apnea (adult) (pediatric): Secondary | ICD-10-CM | POA: Diagnosis not present

## 2018-06-26 DIAGNOSIS — G4733 Obstructive sleep apnea (adult) (pediatric): Secondary | ICD-10-CM | POA: Diagnosis not present

## 2018-09-07 DIAGNOSIS — M79672 Pain in left foot: Secondary | ICD-10-CM | POA: Insufficient documentation

## 2018-09-07 DIAGNOSIS — M1712 Unilateral primary osteoarthritis, left knee: Secondary | ICD-10-CM | POA: Diagnosis not present

## 2018-10-03 DIAGNOSIS — M898X7 Other specified disorders of bone, ankle and foot: Secondary | ICD-10-CM | POA: Diagnosis not present

## 2018-10-03 DIAGNOSIS — M67872 Other specified disorders of synovium, left ankle and foot: Secondary | ICD-10-CM | POA: Diagnosis not present

## 2018-10-03 DIAGNOSIS — M7660 Achilles tendinitis, unspecified leg: Secondary | ICD-10-CM | POA: Insufficient documentation

## 2018-11-16 DIAGNOSIS — M67872 Other specified disorders of synovium, left ankle and foot: Secondary | ICD-10-CM | POA: Diagnosis not present

## 2019-05-10 ENCOUNTER — Other Ambulatory Visit: Payer: Self-pay

## 2019-05-10 ENCOUNTER — Ambulatory Visit
Admission: RE | Admit: 2019-05-10 | Discharge: 2019-05-10 | Disposition: A | Discharge status: Home / Self Care | Payer: 59 | Source: Ambulatory Visit | Attending: Internal Medicine | Admitting: Internal Medicine

## 2019-05-10 ENCOUNTER — Other Ambulatory Visit: Payer: Self-pay | Admitting: Internal Medicine

## 2019-05-10 DIAGNOSIS — G8194 Hemiplegia, unspecified affecting left nondominant side: Secondary | ICD-10-CM

## 2019-05-10 DIAGNOSIS — R531 Weakness: Secondary | ICD-10-CM

## 2019-06-15 ENCOUNTER — Other Ambulatory Visit: Payer: Self-pay | Admitting: Internal Medicine

## 2019-06-15 DIAGNOSIS — G8194 Hemiplegia, unspecified affecting left nondominant side: Secondary | ICD-10-CM

## 2019-06-15 DIAGNOSIS — R531 Weakness: Secondary | ICD-10-CM

## 2019-07-12 ENCOUNTER — Other Ambulatory Visit: Payer: 59

## 2019-07-12 ENCOUNTER — Other Ambulatory Visit: Payer: Self-pay | Admitting: Neurology

## 2019-07-14 ENCOUNTER — Encounter: Payer: Self-pay | Admitting: Neurology

## 2019-07-14 ENCOUNTER — Ambulatory Visit: Payer: 59 | Admitting: Neurology

## 2019-07-14 ENCOUNTER — Other Ambulatory Visit: Payer: Self-pay

## 2019-07-14 DIAGNOSIS — R202 Paresthesia of skin: Secondary | ICD-10-CM | POA: Diagnosis not present

## 2019-07-14 DIAGNOSIS — R200 Anesthesia of skin: Secondary | ICD-10-CM | POA: Diagnosis not present

## 2019-07-14 NOTE — Patient Instructions (Signed)
Somatic Symptom Disorder  People with this disorder have physical (somatic) symptoms that cause distress or affect daily living. However, no other medical condition can be found to explain these symptoms. Somatic symptom disorder may interfere with relationships, work, school, or other daily activities. It may lead to frequent medical visits and many medical tests to try to find the cause of symptoms. It may also lead to surgical procedures that do not help and can cause serious problems. People with somatic symptom disorder are also at risk for alcohol or drug addiction, suicide attempts, and divorce. Somatic symptom disorder may start in childhood but is most common in young adults. The disorder may be triggered by stressful life events. It may last for several years or may come and go throughout life. What are the causes? The exact cause of this condition is often unknown. What increases the risk? The following factors may make you more likely to develop this condition:  Being male. The disorder is more common in females than males.  Having a history of childhood abuse.  Having a history of alcohol or substance abuse.  Having family members with the disorder.  Having other mental health conditions, including personality disorders. What are the signs or symptoms? You may have somatic symptoms that cannot be explained by a medical condition. Examples of somatic symptoms may include:  Pain. This may be the only physical symptom. Pain may involve any part of the body.  Stomach or intestinal symptoms. These may include nausea, indigestion, diarrhea, or abdominal pain.  Other non-specific symptoms such as fatigue, trouble sleeping, headaches, or dizziness. How is this diagnosed? You may be diagnosed with this condition if:  You have one or more somatic symptoms that cause you distress or affect your daily living.  You react to the somatic symptoms in a way that is out of proportion to  the symptoms. The reaction may include: ? Thinking all the time about the severity of the symptoms. ? Feeling very anxious all the time about the symptoms or your general health. ? Spending a lot of time and energy dealing with the symptoms or health concerns.  You have somatic symptoms either continuously or on and off for at least 6 months. Exams and tests will be done to rule out serious physical health problems. After that, your health care provider may refer you to a mental health specialist for psychological evaluation. Somatic symptoms can be related to a number of mental health conditions. How is this treated? This condition is treated with one or more of the following:  Regular follow-up visits with your health care provider for evaluation and reassurance.  Counseling or talk therapy. This involves working with a mental health specialist to: ? Help you understand what triggers your symptoms. ? Help you learn some coping skills to deal with your symptoms.  Medicine. Certain medicines can help with severe anxiety or depression caused by this disorder.  Healthy lifestyle. A balanced diet and regular exercise can reduce stress and somatic symptoms. Follow these instructions at home: Medicines  Take over-the-counter and prescription medicines only as told by your health care provider. Eating and drinking  Eat a healthy diet that includes plenty of vegetables, fruits, whole grains, low-fat dairy products, and lean protein. Do not eat a lot of foods that are high in solid fats, added sugars, or salt. General instructions  Do not use any products that contain nicotine or tobacco, such as cigarettes and e-cigarettes. If you need help quitting, ask your  health care provider.  Do not use illegal drugs. Do not misuse prescription medicines.  Do not drink alcohol if your health care provider tells you not to.  Get regular exercise. Most adults should exercise for at least 150 minutes  each week. Check with your health care provider before starting an exercise program.  Get the right amount and quality of sleep. Most adults need 7-9 hours of sleep each night.  Keep all follow-up visits as told by your health care provider. This is important. Contact a health care provider if:  Your pain or symptoms do not go away or they become severe.  You develop new symptoms. Get help right away if:  You have serious thoughts about hurting yourself or someone else. If you ever feel like you may hurt yourself or others, or have thoughts about taking your own life, get help right away. You can go to your nearest emergency department or call:  Your local emergency services (911 in the [place]  A suicide crisis helpline, such as the [facility] at 425-046-0155. This is open 24 hours a day. Summary  People with somatic symptom disorder have physical symptoms that cause distress or affect daily living. However, no other medical condition can be found to explain these symptoms.  Somatic symptom disorder may interfere with relationships, work, school, or other daily activities. It may lead to frequent medical visits and many medical tests to try to find the cause of symptoms.  Exams and tests will be done to rule out serious physical health problems. After that, your health care provider may refer you to a mental health specialist for psychological evaluation. This information is not intended to replace advice given to you by your health care provider. Make sure you discuss any questions you have with your health care provider. Document Revised: 08/03/2017 Document Reviewed: 08/03/2017 Elsevier Patient Education  2020 [employer].

## 2019-07-14 NOTE — Progress Notes (Signed)
SLEEP MEDICINE CLINIC    Provider:  Melvyn Novas, MD  Primary Care Physician:  Georgianne Fick, MD [address] [place] [address]     Referring Provider: Georgianne Fick, Md [address] [place],  [place] [address]          Chief Complaint according to patient   Patient presents with:    . New Patient (Initial Visit)     pt states that he has been having issues with numbness on the left side of abd and then in the arm. grip strength in the left side decreased. this happens daily sometimes 2-3 times a day and comes without any warning.       HISTORY OF PRESENT ILLNESS:  Andre Salazar is a 59 year old African American male patient and seen here  on 07/14/2019 from Dr Nicholos Johns- Chief concern according to patient :  see above,    I have the pleasure of seeing Andre Salazar today, a right -handed Black or [place] American male with  a medical history of Hyperlipidemia and Hypertension., and obesity. He was seen by me in 2018 for a sleep study.     The patient describes paroxysmal spells of numbness on the left side of the body , involving the lateral thigh torso and left arm, and sometimes his left arm able wheelchair so spells.  He has not found a trigger factor that seem to be able to occur many times a day and all kinds of activities.Marland Kitchen  He feels subjectively as if the left side is weaker during the spells and he describes a tingling numbness.  He also describes that he seems to drift to the left leaning over as he feels that he has not the same muscle tone and strength. He can type for example with both hands through the spell.  Spells can last 30 seconds to a minute ,is self-limited, it can recur during the same day up to 3 or 4 times. it does not prefer the evening or the morning hours, it happens while driving or while sitting still. Not dependent on meal times. He recovers as quick as it started, back to normal. No  LOC, no fainting, no vertigo.   Family medical /sleep history: no strokes, HTN, CAD or DM.    Social history:  Patient is working as the Warehouse manager for a [employer]       Review of Systems: Out of a complete 14 system review, the patient complains of only the following symptoms, and all other reviewed systems are negative.:   No vertigo, high stress at work, limited sleep time. Worried.    Social History   Socioeconomic History  . Marital status: Married    Spouse name: Not on file  . Number of children: Not on file  . Years of education: Not on file  . Highest education level: Not on file  Occupational History  . Not on file  Tobacco Use  . Smoking status: Never Smoker  . Smokeless tobacco: Never Used  Substance and Sexual Activity  . Alcohol use: Yes  . Drug use: No  . Sexual activity: Not on file  Other Topics Concern  . Not on file  Social History Narrative  . Not on file   Social Determinants of Health   Financial Resource Strain:   . Difficulty of Paying Living Expenses: Not on file  Food Insecurity:   . Worried About [employer] of  Food in the Last Year: Not on file  . Ran Out of Food in the Last Year: Not on file  Transportation Needs:   . Lack of Transportation (Medical): Not on file  . Lack of Transportation (Non-Medical): Not on file  Physical Activity:   . Days of Exercise per Week: Not on file  . Minutes of Exercise per Session: Not on file  Stress:   . Feeling of Stress : Not on file  Social Connections:   . Frequency of Communication with Friends and Family: Not on file  . Frequency of Social Gatherings with Friends and Family: Not on file  . Attends Religious Services: Not on file  . Active Member of Clubs or Organizations: Not on file  . Attends Banker Meetings: Not on file  . Marital Status: Not on file    Family History  Problem Relation Age of Onset  . Hypertension Other   . Cirrhosis Father   . Alcohol abuse  Father     Past Medical History:  Diagnosis Date  . Hyperlipidemia   . Hypertension     Past Surgical History:  Procedure Laterality Date  . COLONOSCOPY  2015  . NASAL SINUS SURGERY  1980     Current Outpatient Medications on File Prior to Visit  Medication Sig Dispense Refill  . atorvastatin (LIPITOR) 40 MG tablet Take 40 mg by mouth daily.    . cholecalciferol (VITAMIN D) 1000 units tablet Take 1,000 Units by mouth daily.    . Cyanocobalamin (VITAMIN B 12) 100 MCG LOZG Take 1 lozenge by mouth daily.    . fluticasone (FLONASE) 50 MCG/ACT nasal spray Place into both nostrils daily.    Marland Kitchen MOBIC 15 MG tablet Take 1 tablet by mouth once a day  take daily for two weeks, and then only as needed afterwards.    . Multiple Vitamin (MULTIVITAMIN) tablet Take 1 tablet by mouth daily.    . sildenafil (VIAGRA) 100 MG tablet sildenafil 100 mg tablet    . triamterene-hydrochlorothiazide (DYAZIDE) 37.5-25 MG capsule Take 1 capsule by mouth daily.     No current facility-administered medications on file prior to visit.    Allergies  Allergen Reactions  . Iodine     Physical exam:  Today's Vitals   07/14/19 1124  BP: 134/83  Pulse: 70  Temp: 98.6 F (37 C)  Weight: 250 lb (113.4 kg)  Height: 5\' 11"  (1.803 m)   Body mass index is 34.87 kg/m.   Wt Readings from Last 3 Encounters:  07/14/19 250 lb (113.4 kg)  06/23/17 251 lb (113.9 kg)     Ht Readings from Last 3 Encounters:  07/14/19 5\' 11"  (1.803 m)  06/23/17 5\' 11"  (1.803 m)      General: The patient is awake, alert and appears not in acute distress. The patient is well groomed. Head: Normocephalic, atraumatic. Neck is supple. Mallampati 3,  Cardiovascular:  Regular rate and cardiac rhythm by pulse,  without distended neck veins. Respiratory: Lungs are clear to auscultation.  Skin:  Without evidence of ankle edema, or rash. Trunk: The patient's posture is erect.   Neurologic exam : The patient is awake and alert,  oriented to place and time.   Memory subjective described as intact.  Attention span & concentration ability appears normal.  Speech is fluent,  without  dysarthria, dysphonia or aphasia.  Mood and affect are appropriate.   Cranial nerves: no loss of smell or taste reported  Pupils are equal  and briskly reactive to light. Funduscopic exam intact..  Extraocular movements in vertical and horizontal planes were intact and without nystagmus. No Diplopia. Visual fields by finger perimetry are intact. Hearing was intact to soft voice and finger rubbing.    Facial sensation intact to fine touch.  Facial motor strength is symmetric and tongue and uvula move midline.  Neck ROM : rotation, tilt and flexion extension were normal for age and shoulder shrug was symmetrical.    Motor exam:  Symmetric bulk, tone and ROM.   elvated tone without cog- wheeling, symmetric grip strength .   Sensory:  Fine touch, pinprick and vibration were  normal.  Proprioception tested in the upper extremities was normal.   Coordination: Rapid alternating movements in the fingers/hands were of normal speed.  The Finger-to-nose maneuver was intact without evidence of ataxia, dysmetria or tremor.   Gait and station: Patient could rise unassisted from a seated position, walked without assistive device.  Stance is of normal width/ base and the patient turned with 4 steps.  Toe and heel walk were deferred.  Deep tendon reflexes: in the  upper and lower extremities are symmetric and intact.  Babinski response was deferred .       I would like to thank Dr. Ashby Dawes for providing extensive records for me to review which includes that the patient's complaint started approximately 6 to 8 weeks ago, that he had 4 or 5 episodes in the first months and now has multiple episodes each day.  Sudden onset of weakness involving left arm the upper left leg outside of his leg and it leads him to start swaying towards the left side he  has not fallen and he denies any vertigo there is no asymmetry of reflexes and these episodes last seconds maybe a minute.  During one episode at least he had increasing tearing in the left eye but no associated headache or nausea has been there no chest pain no palpitation.  Dr. Ashby Dawes ordered an MRI of the brain which returned as normal I was able to see the report but it was done through [facility] health which does not allow me to look at the imaging study itself.  This was performed on 23 June 2019 the only finding was a small benign-appearing cyst of approximately 8 mm in the posterior aspect of the sella turcica this could be related to the pituitary gland it is not likely to affect the patient's symptoms.   My physical exam is normal.  I wonder if a nonorganic reason for the spells could be existing the patient is working under great stress these days he bears responsibility for a lot of projects, he has a history of unrelated left facial numbness related to Bell's palsy in the past which have been treated with steroids and antivirals and have resolved pretty much.  There was no facial sensory loss in conjunction with the extremity or left torso being affected.   The distribution is not typical for a CNS manifestation and would also not be typical for a cervical spine manifestation.    After spending a total time of  40  minutes face to face and additional time for physical and neurologic examination, review of laboratory studies,  personal review of imaging studies, reports and results of other testing and review of referral information / records as far as provided in visit, I have established the following assessments:  1) non anatomical distribution of numbness, proximal weakness in the left body, the lateral  left leg, thigh, lateral torso and left arm up to shoulder. 2) patient denies any anxiety. 3) he sleeps more than 6 hours each night. He had PVCs, no stroke, no TIA not MS, and no LOC  has been reported.     I am unable to render a diagnosis, and would like to suggest a tertiary center for further work up.   I would like to thank Georgianne Fick, MD  [address] [place],  [place] [address] for allowing me to meet with and to take care of this pleasant patient.    Electronically signed by: Melvyn Novas, MD 07/14/2019 11:48 AM  [facility] Board certified by The [employer] of Sleep Medicine and Diplomate of the [employer] of Sleep Medicine. Board certified In Neurology through the ABPN, Fellow of the [employer] of Neu[employer]. Medical Director of [facility].

## 2020-08-02 ENCOUNTER — Other Ambulatory Visit: Payer: Self-pay | Admitting: Internal Medicine

## 2020-08-02 ENCOUNTER — Ambulatory Visit
Admission: RE | Admit: 2020-08-02 | Discharge: 2020-08-02 | Disposition: A | Discharge status: Home / Self Care | Payer: 59 | Source: Ambulatory Visit | Attending: Internal Medicine | Admitting: Internal Medicine

## 2020-08-02 DIAGNOSIS — E041 Nontoxic single thyroid nodule: Secondary | ICD-10-CM

## 2020-08-14 ENCOUNTER — Other Ambulatory Visit: Payer: 59

## 2021-07-23 ENCOUNTER — Emergency Department (HOSPITAL_COMMUNITY): Payer: 59

## 2021-07-23 ENCOUNTER — Encounter (HOSPITAL_COMMUNITY): Payer: Self-pay

## 2021-07-23 ENCOUNTER — Observation Stay (HOSPITAL_COMMUNITY)
Admission: EM | Admit: 2021-07-23 | Discharge: 2021-07-24 | Disposition: A | Discharge status: Home / Self Care | Payer: 59 | Attending: Internal Medicine | Admitting: Internal Medicine

## 2021-07-23 ENCOUNTER — Other Ambulatory Visit: Payer: Self-pay

## 2021-07-23 DIAGNOSIS — Z79899 Other long term (current) drug therapy: Secondary | ICD-10-CM | POA: Diagnosis not present

## 2021-07-23 DIAGNOSIS — R1084 Generalized abdominal pain: Secondary | ICD-10-CM | POA: Diagnosis not present

## 2021-07-23 DIAGNOSIS — R55 Syncope and collapse: Secondary | ICD-10-CM | POA: Diagnosis not present

## 2021-07-23 DIAGNOSIS — R110 Nausea: Secondary | ICD-10-CM | POA: Insufficient documentation

## 2021-07-23 DIAGNOSIS — R079 Chest pain, unspecified: Secondary | ICD-10-CM

## 2021-07-23 DIAGNOSIS — I10 Essential (primary) hypertension: Secondary | ICD-10-CM | POA: Insufficient documentation

## 2021-07-23 DIAGNOSIS — Z8616 Personal history of COVID-19: Secondary | ICD-10-CM | POA: Diagnosis not present

## 2021-07-23 DIAGNOSIS — R072 Precordial pain: Secondary | ICD-10-CM | POA: Diagnosis not present

## 2021-07-23 DIAGNOSIS — Z20822 Contact with and (suspected) exposure to covid-19: Secondary | ICD-10-CM | POA: Insufficient documentation

## 2021-07-23 LAB — COMPREHENSIVE METABOLIC PANEL
ALT: 43 U/L (ref 0–44)
AST: 49 U/L — ABNORMAL HIGH (ref 15–41)
Albumin: 4.6 g/dL (ref 3.5–5.0)
Alkaline Phosphatase: 72 U/L (ref 38–126)
Anion gap: 9 (ref 5–15)
BUN: 10 mg/dL (ref 8–23)
CO2: 29 mmol/L (ref 22–32)
Calcium: 9.7 mg/dL (ref 8.9–10.3)
Chloride: 99 mmol/L (ref 98–111)
Creatinine, Ser: 0.94 mg/dL (ref 0.61–1.24)
GFR, Estimated: >60 mL/min (ref >60)
Glucose, Bld: 126 mg/dL — ABNORMAL HIGH (ref 70–99)
Potassium: 4.1 mmol/L (ref 3.5–5.1)
Sodium: 137 mmol/L (ref 135–145)
Total Bilirubin: 0.8 mg/dL (ref 0.3–1.2)
Total Protein: 8.9 g/dL — ABNORMAL HIGH (ref 6.5–8.1)

## 2021-07-23 LAB — I-STAT CHEM 8, ED
BUN: 9 mg/dL (ref 8–23)
Calcium, Ion: 1.21 mmol/L (ref 1.15–1.40)
Chloride: 100 mmol/L (ref 98–111)
Creatinine, Ser: 0.8 mg/dL (ref 0.61–1.24)
Glucose, Bld: 120 mg/dL — ABNORMAL HIGH (ref 70–99)
HCT: 50 % (ref 39.0–52.0)
Hemoglobin: 17 g/dL (ref 13.0–17.0)
Potassium: 4.1 mmol/L (ref 3.5–5.1)
Sodium: 140 mmol/L (ref 135–145)
TCO2: 30 mmol/L (ref 22–32)

## 2021-07-23 LAB — CBC WITH DIFFERENTIAL/PLATELET
Abs Immature Granulocytes: 0.02 10*3/uL (ref 0.00–0.07)
Basophils Absolute: 0.1 10*3/uL (ref 0.0–0.1)
Basophils Relative: 1 %
Eosinophils Absolute: 0.1 10*3/uL (ref 0.0–0.5)
Eosinophils Relative: 1 %
HCT: 47.4 % (ref 39.0–52.0)
Hemoglobin: 16.4 g/dL (ref 13.0–17.0)
Immature Granulocytes: 0 %
Lymphocytes Relative: 30 %
Lymphs Abs: 1.9 10*3/uL (ref 0.7–4.0)
MCH: 32.5 pg (ref 26.0–34.0)
MCHC: 34.6 g/dL (ref 30.0–36.0)
MCV: 94 fL (ref 80.0–100.0)
Monocytes Absolute: 0.5 10*3/uL (ref 0.1–1.0)
Monocytes Relative: 8 %
Neutro Abs: 3.8 10*3/uL (ref 1.7–7.7)
Neutrophils Relative %: 60 %
Platelets: 160 10*3/uL (ref 150–400)
RBC: 5.04 MIL/uL (ref 4.22–5.81)
RDW: 13 % (ref 11.5–15.5)
WBC: 6.3 10*3/uL (ref 4.0–10.5)
nRBC: 0 % (ref 0.0–0.2)

## 2021-07-23 LAB — LIPASE, BLOOD: Lipase: 58 U/L — ABNORMAL HIGH (ref 11–51)

## 2021-07-23 LAB — D-DIMER, QUANTITATIVE: D-Dimer, Quant: 0.59 ug/mL-FEU — ABNORMAL HIGH (ref 0.00–0.50)

## 2021-07-23 LAB — TROPONIN I (HIGH SENSITIVITY)
Troponin I (High Sensitivity): 5 ng/L (ref <18)
Troponin I (High Sensitivity): 5 ng/L (ref <18)

## 2021-07-23 MED ORDER — LIDOCAINE VISCOUS HCL 2 % MT SOLN
15.0000 mL | Freq: Once | OROMUCOSAL | Status: AC
  Administered 2021-07-23: 15 mL via ORAL
  Filled 2021-07-23: qty 15

## 2021-07-23 MED ORDER — IOHEXOL 350 MG/ML SOLN
100.0000 mL | Freq: Once | INTRAVENOUS | Status: AC | PRN
  Administered 2021-07-23: 100 mL via INTRAVENOUS

## 2021-07-23 MED ORDER — ALUM & MAG HYDROXIDE-SIMETH 200-200-20 MG/5ML PO SUSP
30.0000 mL | Freq: Once | ORAL | Status: AC
  Administered 2021-07-23: 30 mL via ORAL
  Filled 2021-07-23: qty 30

## 2021-07-23 NOTE — ED Provider Triage Note (Signed)
Emergency Medicine Provider Triage Evaluation Note  Andre Salazar , a 61 y.o. male  was evaluated in triage.  Pt complains of syncope and chest pain.  Earlier today while at work he was in a meeting and started having severe pain throughout his abdomen and chest and then started to feel hot and sweaty, even woke up sitting in a chair in his office with paramedics and was told he passed out for a few minutes.  He has no prior history of this happening he reports he feels generally weak now and is continuing to have some chest and abdominal pain.  Patient reports he feels hot and sweaty.  Sister reports that this is normal for him.  No prior history of the same.  History of heart murmur as a child, and is treated for hypertension but no cardiac history.  Patient had recent shoulder surgery and decided, no complications with recovery thus far.  Review of Systems  Positive: Chest pain, abdominal pain, diaphoresis, syncope Negative: Fever, cough  Physical Exam  BP (!) 141/92    Pulse 71    Temp 98.5 F (36.9 C) (Oral)    Resp 18    SpO2 99%  Gen:   Awake, ill-appearing Resp:  Normal effort, CTA bilaterally Abdomen: No tenderness throughout the abdomen MSK:   Moves extremities without difficulty  Other:    Medical Decision Making  Medically screening exam initiated at 4:16 PM.  Appropriate orders placed.  Andre Salazar was informed that the remainder of the evaluation will be completed by another provider, this initial triage assessment does not replace that evaluation, and the importance of remaining in the ED until their evaluation is complete.  Patient with syncopal episode and chest pain, ill-appearing, concern for potential dissection, PE or ACS.  Patient will be taken directly back to bed for further evaluation.   Andre Salazar, [place] 07/23/21 [phone]

## 2021-07-23 NOTE — ED Triage Notes (Signed)
Pt sister stated pt had a LOC today lasting at least a few minutes. Pt c/o weakness. Pt c/o chest pain starting today, was sweating, and then had his LOC. Pt states he currently has chest pain that does down to his abdomen. Pt states he felt normal yesterday. Pt unsure if he fell at all during his LOC.

## 2021-07-23 NOTE — ED Provider Notes (Signed)
[facility] DEPT Provider Note   CSN: [id number] Arrival date & time: 07/23/21  1611     History  Chief Complaint  Patient presents with   Chest Pain   Loss of Consciousness    Andre Salazar is a 61 y.o. male.   Chest Pain Associated symptoms: abdominal pain, diaphoresis, nausea and syncope   Loss of Consciousness Associated symptoms: chest pain, diaphoresis and nausea    HPI: A 61 year old patient with a history of hypertension, hypercholesterolemia and obesity presents for evaluation of chest pain. Initial onset of pain was approximately 3-6 hours ago. The patient's chest pain is not worse with exertion. The patient reports some diaphoresis. The patient's chest pain is middle- or left-sided, is not well-localized, is not described as heaviness/pressure/tightness, is not sharp and does radiate to the arms/jaw/neck. The patient does not complain of nausea. The patient has no history of stroke, has no history of peripheral artery disease, has not smoked in the past 90 days, denies any history of treated diabetes and has no relevant family history of coronary artery disease (first degree relative at less than age 6).  61 year old male with a history of HLD and HTN who had an episode of severe cramping and severe abdominal pain that radiated to his epigastrium and substernal chest area which then resulted in a syncopal episode. He experienced nausea, diaphoresis, and then synopsized which was witnessed.  He came to with EMS and has had no further episodes of syncope.  He currently denies any chest pain, shortness of breath.  In triage, he did endorse chest pain radiating down to his abdomen.  This has since resolved.  Home Medications Prior to Admission medications   Medication Sig Start Date End Date Taking? Authorizing Provider  atorvastatin (LIPITOR) 40 MG tablet Take 40 mg by mouth daily. 06/15/19  Yes [provider]  cholecalciferol  (VITAMIN D) 1000 units tablet Take 1,000 Units by mouth daily.   Yes [provider]  MOBIC 15 MG tablet Take 15 mg by mouth daily as needed for pain. 03/22/19  Yes [provider]  Multiple Vitamin (MULTIVITAMIN) tablet Take 1 tablet by mouth daily.   Yes [provider]  sildenafil (VIAGRA) 100 MG tablet Take 100 mg by mouth as needed for erectile dysfunction.   Yes [provider]  triamterene-hydrochlorothiazide (DYAZIDE) 37.5-25 MG capsule Take 1 capsule by mouth daily.   Yes [provider]  zinc gluconate 50 MG tablet Take 50 mg by mouth daily.   Yes [provider]      Allergies    Shellfish allergy and Iodine    Review of Systems   Review of Systems  Constitutional:  Positive for diaphoresis.  Cardiovascular:  Positive for chest pain and syncope.  Gastrointestinal:  Positive for abdominal pain and nausea.  Neurological:  Positive for syncope.  All other systems reviewed and are negative.  Physical Exam Updated Vital Signs BP (!) 145/103    Pulse 74    Temp 98.4 F (36.9 C) (Oral)    Resp 19    Ht 5\' 11"  (1.803 m)    Wt 99.8 kg    SpO2 99%    BMI 30.68 kg/m  Physical Exam Vitals and nursing note reviewed.  Constitutional:      General: He is not in acute distress.    Appearance: He is well-developed.  HENT:     Head: Normocephalic and atraumatic.  Eyes:     Conjunctiva/sclera:  Conjunctivae normal.     Pupils: Pupils are equal, round, and reactive to light.  Cardiovascular:     Rate and Rhythm: Normal rate and regular rhythm.     Heart sounds: No murmur heard. Pulmonary:     Effort: Pulmonary effort is normal. No respiratory distress.     Breath sounds: Normal breath sounds.  Abdominal:     General: There is no distension.     Palpations: Abdomen is soft.     Tenderness: There is no abdominal tenderness. There is no guarding.  Musculoskeletal:        General: No swelling, deformity or signs of injury.      Cervical back: Neck supple.     Right lower leg: No edema.     Left lower leg: No edema.  Skin:    General: Skin is warm and dry.     Capillary Refill: Capillary refill takes less than 2 seconds.     Findings: No lesion or rash.  Neurological:     General: No focal deficit present.     Mental Status: He is alert and oriented to person, place, and time. Mental status is at baseline.     Cranial Nerves: No cranial nerve deficit.     Motor: No weakness.  Psychiatric:        Mood and Affect: Mood normal.    ED Results / Procedures / Treatments   Labs (all labs ordered are listed, but only abnormal results are displayed) Labs Reviewed  COMPREHENSIVE METABOLIC PANEL - Abnormal; Notable for the following components:      Result Value   Glucose, Bld 126 (*)    Total Protein 8.9 (*)    AST 49 (*)    All other components within normal limits  LIPASE, BLOOD - Abnormal; Notable for the following components:   Lipase 58 (*)    All other components within normal limits  D-DIMER, QUANTITATIVE - Abnormal; Notable for the following components:   D-Dimer, Quant 0.59 (*)    All other components within normal limits  I-STAT CHEM 8, ED - Abnormal; Notable for the following components:   Glucose, Bld 120 (*)    All other components within normal limits  RESP PANEL BY RT-PCR (FLU A&B, COVID) ARPGX2  CBC WITH DIFFERENTIAL/PLATELET  HIV ANTIBODY (ROUTINE TESTING W REFLEX)  CBC  CREATININE, SERUM  MAGNESIUM  BASIC METABOLIC PANEL  CBC  TROPONIN I (HIGH SENSITIVITY)  TROPONIN I (HIGH SENSITIVITY)    EKG EKG Interpretation  Date/Time:  Wednesday July 23 2021 16:26:31 EST Ventricular Rate:  76 PR Interval:  220 QRS Duration: 89 QT Interval:  368 QTC Calculation: 414 R Axis:   89 Text Interpretation: Sinus rhythm Prolonged PR interval Probable anteroseptal infarct, old Confirmed by Regan Lemming ([id number]) on 07/23/2021 4:54:44 PM  Radiology DG Chest 2 View  Result Date:  07/23/2021 CLINICAL DATA:  Syncope, chest pain EXAM: CHEST - 2 VIEW COMPARISON:  None. FINDINGS: The heart size and mediastinal contours are within normal limits. Both lungs are clear. The visualized skeletal structures are unremarkable. IMPRESSION: No active cardiopulmonary disease. Electronically Signed   By: Franchot Gallo M.D.   On: 07/23/2021 17:22   CT Angio Chest/Abd/Pel for Dissection W and/or Wo Contrast  Result Date: 07/23/2021 CLINICAL DATA:  Chest pain, back pain, syncope EXAM: CT ANGIOGRAPHY CHEST, ABDOMEN AND PELVIS TECHNIQUE: Non-contrast CT of the chest was initially obtained. Multidetector CT imaging through the chest, abdomen and pelvis was performed using the standard protocol  during bolus administration of intravenous contrast. Multiplanar reconstructed images and MIPs were obtained and reviewed to evaluate the vascular anatomy. RADIATION DOSE REDUCTION: This exam was performed according to the departmental dose-optimization program which includes automated exposure control, adjustment of the mA and/or kV according to patient size and/or use of iterative reconstruction technique. CONTRAST:  OMNIPAQUE IOHEXOL 350 MG/ML SOLN COMPARISON:  07/23/2021 FINDINGS: CTA CHEST FINDINGS Cardiovascular: No evidence of thoracic aortic aneurysm or dissection. Minimal atherosclerosis. The heart is unremarkable without pericardial effusion. While not optimized for opacification of the pulmonary vasculature, there is sufficient contrast enhancement to exclude pulmonary emboli. No filling defects. Mediastinum/Nodes: No enlarged mediastinal, hilar, or axillary lymph nodes. Thyroid gland, trachea, and esophagus demonstrate no significant findings. Lungs/Pleura: No acute airspace disease, effusion, or pneumothorax. Central airways are patent. Musculoskeletal: No acute or destructive bony lesions. Multilevel thoracic spondylosis. Reconstructed images demonstrate no additional findings. Review of the MIP  images confirms the above findings. CTA ABDOMEN AND PELVIS FINDINGS VASCULAR Aorta: Normal caliber aorta without aneurysm, dissection, vasculitis or significant stenosis. Celiac: Patent without evidence of aneurysm, dissection, vasculitis or significant stenosis. SMA: Patent without evidence of aneurysm, dissection, vasculitis or significant stenosis. Renals: Both renal arteries are patent without evidence of aneurysm, dissection, vasculitis, fibromuscular dysplasia or significant stenosis. IMA: Patent without evidence of aneurysm, dissection, vasculitis or significant stenosis. Inflow: Patent without evidence of aneurysm, dissection, vasculitis or significant stenosis. Veins: No obvious venous abnormality within the limitations of this arterial phase study. Review of the MIP images confirms the above findings. NON-VASCULAR Hepatobiliary: No focal liver abnormality is seen. No gallstones, gallbladder wall thickening, or biliary dilatation. Pancreas: Unremarkable. No pancreatic ductal dilatation or surrounding inflammatory changes. Spleen: Normal in size without focal abnormality. Adrenals/Urinary Tract: Adrenal glands are unremarkable. Kidneys are normal, without renal calculi, focal lesion, or hydronephrosis. Bladder is unremarkable. Stomach/Bowel: No bowel obstruction or ileus. Normal appendix right lower quadrant. No bowel wall thickening or inflammatory change. Lymphatic: No pathologic adenopathy. Reproductive: Prostate is unremarkable. Other: No free fluid or free gas.  No abdominal wall hernia. Musculoskeletal: No acute or destructive bony lesions. Multilevel lumbar spondylosis and facet hypertrophy, greatest at the lumbosacral junction. Reconstructed images demonstrate no additional findings. Review of the MIP images confirms the above findings. IMPRESSION: 1. No evidence of thoracoabdominal aortic aneurysm or dissection. 2. No evidence of pulmonary embolus. 3. No acute intrathoracic, intra-abdominal, or  intrapelvic process. Electronically Signed   By: Sharlet Salina M.D.   On: 07/23/2021 18:03    Procedures Procedures    Medications Ordered in ED Medications  atorvastatin (LIPITOR) tablet 40 mg (has no administration in time range)  enoxaparin (LOVENOX) injection 40 mg (has no administration in time range)  hydrALAZINE (APRESOLINE) injection 10 mg (has no administration in time range)  iohexol (OMNIPAQUE) 350 MG/ML injection 100 mL (100 mLs Intravenous Contrast Given 07/23/21 1738)  alum & mag hydroxide-simeth (MAALOX/MYLANTA) 200-200-20 MG/5ML suspension 30 mL (30 mLs Oral Given 07/23/21 2120)    And  lidocaine (XYLOCAINE) 2 % viscous mouth solution 15 mL (15 mLs Oral Given 07/23/21 2120)    ED Course/ Medical Decision Making/ A&P   HEAR Score: 4                       Medical Decision Making Amount and/or Complexity of Data Reviewed Labs: ordered.  Risk OTC drugs. Prescription drug management. Decision regarding hospitalization.   61 year old male with a history of HLD and HTN who had an  episode of severe cramping and severe abdominal pain that radiated to his epigastrium and substernal chest area which then resulted in a syncopal episode. He experienced nausea, diaphoresis, and then synopsized which was witnessed.  He came to with EMS and has had no further episodes of syncope.  He currently denies any chest pain, shortness of breath.  In triage, he did endorse chest pain radiating down to his abdomen.  This has since resolved.  On arrival, the patient was afebrile, hemodynamically stable, mildly hypertensive BP 141/92, saturating well on room air.  Patient telemetry revealed normal sinus rhythm.  Presenting after syncopal episode that was triggered by severe abdominal pain and chest pain.  Differential diagnosis includes vasovagal syncope, gas pain, aortic dissection, AAA with rupture, SBO, pancreatitis, gastritis, PE, ACS, structural cardiac abnormality or cardiac  arrhythmia.  The patient's EKG revealed normal sinus rhythm with prolonged PR, no ST segment changes.  Chest x-ray was reviewed by myself and radiology revealed no acute cardiopulmonary disease.  A CTA chest abdomen pelvis was performed to evaluate for aortic aneurysm or aortic dissection or other acute abnormality which revealed no acute abnormalities in the chest, abdomen or pelvis.  Contrast bolus timing was limited but there was no evidence of pulmonary embolus.  Due to the limitation of contrast bolus timing, I did obtain a D-dimer which was found to be normal after adjustment for age at 0.59.  The patient's lipase was mildly elevated at 58, he had troponins x2 which were negative, and unremarkable CMP, COVID-19 and influenza PCR testing that was unremarkable and a CBC with differential that was also unremarkable.  He did feel somewhat symptomatically improved following administration of viscous lidocaine and Maalox which suggest possible gastritis or GERD and esophageal spasm resulting in vasovagal syncope although this does not explain the patient's abdominal discomfort.  I discussed with the patient his negative work-up to date.  Low concern for PE, ACS.  No acute abnormalities noted in the abdomen, pelvis or chest based on extensive work-up at this time.  Patient presents with symptoms as moderate risk syncopal episode in the setting of chest pain and abdominal pain.  After discussion with the patient, decision was made to admit the patient for observation on cardiac telemetry overnight with a plan for TTE in the morning to evaluate for possible structural abnormality.  While in the emergency department, the patient had no recurrence of his chest pain or abdominal pain and remained well-appearing and hemodynamically stable.  No further episodes of syncope.  I spoke to hospitalist medicine, Dr. Hal Hope who accepted the patient in admission.    Final Clinical Impression(s) / ED Diagnoses Final  diagnoses:  Syncope and collapse  Generalized abdominal pain  Chest pain, unspecified type    Rx / DC Orders ED Discharge Orders     None         Regan Lemming, MD 07/24/21 0206

## 2021-07-23 NOTE — ED Notes (Signed)
Unsuccessful 20g IV attempt in R AC.

## 2021-07-24 ENCOUNTER — Encounter (HOSPITAL_COMMUNITY): Payer: Self-pay | Admitting: Internal Medicine

## 2021-07-24 ENCOUNTER — Observation Stay (HOSPITAL_BASED_OUTPATIENT_CLINIC_OR_DEPARTMENT_OTHER): Payer: 59

## 2021-07-24 ENCOUNTER — Other Ambulatory Visit (HOSPITAL_COMMUNITY): Payer: 59

## 2021-07-24 DIAGNOSIS — I10 Essential (primary) hypertension: Secondary | ICD-10-CM | POA: Diagnosis present

## 2021-07-24 DIAGNOSIS — R55 Syncope and collapse: Secondary | ICD-10-CM

## 2021-07-24 LAB — ECHOCARDIOGRAM COMPLETE BUBBLE STUDY
AR max vel: 3.84 cm2
AV Area VTI: 3.99 cm2
AV Area mean vel: 3.74 cm2
AV Mean grad: 2 mmHg
AV Peak grad: 4.6 mmHg
Ao pk vel: 1.07 m/s
Area-P 1/2: 2.8 cm2
Calc EF: 39.5 %
S' Lateral: 1.7 cm
Single Plane A2C EF: 51.8 %
Single Plane A4C EF: 25.4 %

## 2021-07-24 LAB — BASIC METABOLIC PANEL
Anion gap: 9 (ref 5–15)
BUN: 12 mg/dL (ref 8–23)
CO2: 28 mmol/L (ref 22–32)
Calcium: 9.5 mg/dL (ref 8.9–10.3)
Chloride: 101 mmol/L (ref 98–111)
Creatinine, Ser: 0.83 mg/dL (ref 0.61–1.24)
GFR, Estimated: >60 mL/min (ref >60)
Glucose, Bld: 106 mg/dL — ABNORMAL HIGH (ref 70–99)
Potassium: 3.6 mmol/L (ref 3.5–5.1)
Sodium: 138 mmol/L (ref 135–145)

## 2021-07-24 LAB — RESP PANEL BY RT-PCR (FLU A&B, COVID) ARPGX2
Influenza A by PCR: NEGATIVE
Influenza B by PCR: NEGATIVE
SARS Coronavirus 2 by RT PCR: NEGATIVE

## 2021-07-24 LAB — CBC
HCT: 43.3 % (ref 39.0–52.0)
Hemoglobin: 15.1 g/dL (ref 13.0–17.0)
MCH: 32.6 pg (ref 26.0–34.0)
MCHC: 34.9 g/dL (ref 30.0–36.0)
MCV: 93.5 fL (ref 80.0–100.0)
Platelets: 147 10*3/uL — ABNORMAL LOW (ref 150–400)
RBC: 4.63 MIL/uL (ref 4.22–5.81)
RDW: 13 % (ref 11.5–15.5)
WBC: 6.8 10*3/uL (ref 4.0–10.5)
nRBC: 0 % (ref 0.0–0.2)

## 2021-07-24 LAB — HIV ANTIBODY (ROUTINE TESTING W REFLEX): HIV Screen 4th Generation wRfx: NONREACTIVE

## 2021-07-24 LAB — MAGNESIUM: Magnesium: 2.4 mg/dL (ref 1.7–2.4)

## 2021-07-24 MED ORDER — HYDRALAZINE HCL 20 MG/ML IJ SOLN: 10.0000 mg | INTRAMUSCULAR | Status: DC | PRN

## 2021-07-24 MED ORDER — SODIUM CHLORIDE 0.9 % IV BOLUS
1000.0000 mL | Freq: Once | INTRAVENOUS | Status: AC
  Administered 2021-07-24: 1000 mL via INTRAVENOUS

## 2021-07-24 MED ORDER — ATORVASTATIN CALCIUM 40 MG PO TABS
40.0000 mg | ORAL_TABLET | Freq: Every day | ORAL | Status: DC
  Filled 2021-07-24: qty 1

## 2021-07-24 MED ORDER — ENOXAPARIN SODIUM 40 MG/0.4ML IJ SOSY
40.0000 mg | PREFILLED_SYRINGE | INTRAMUSCULAR | Status: DC
  Administered 2021-07-24: 40 mg via SUBCUTANEOUS
  Filled 2021-07-24: qty 0.4

## 2021-07-24 NOTE — H&P (Addendum)
History and Physical    Andre Salazar [id number] DOB: 1961-06-14 DOA: 07/23/2021  PCP: Merrilee Seashore, MD  Patient coming from: Home.  Chief Complaint: Loss of consciousness.  HPI: Andre Salazar is a 61 y.o. male with history of hypertension and hyperlipidemia was brought to the ER after patient had brief episode of loss of consciousness.  Patient states he was at his workplace when he started having abdominal discomfort which radiated to his chest he became diaphoretic and lost consciousness.  He may have lost consciousness for about 5 minutes.  Did not have any tongue bite or incontinence of urine.  Patient has not had any previous episodes of losing consciousness.  He has had some diarrhea yesterday.  2 weeks ago patient was treated for COVID infection.  ED Course: In the ER patient was hemodynamically stable CT angiogram of the chest abdomen pelvis done for dissection was unremarkable.  Labs negative for troponins EKG shows normal sinus rhythm with QTC of 414 ms.  AST is 49 and a lipase of 58.  Patient admitted for further management of syncope.  COVID test was negative.  Review of Systems: As per HPI, rest all negative.   Past Medical History:  Diagnosis Date   Hyperlipidemia    Hypertension     Past Surgical History:  Procedure Laterality Date   COLONOSCOPY  2015   [place]     reports that he has never smoked. He has never used smokeless tobacco. He reports current alcohol use. He reports that he does not use drugs.  Allergies  Allergen Reactions   Shellfish Allergy Swelling and Other (See Comments)    Eyes swell   Iodine Swelling and Other (See Comments)    Shellfish allergy and eyes swell    Family History  Problem Relation Age of Onset   Hypertension Other    Cirrhosis Father    Alcohol abuse Father     Prior to Admission medications   Medication Sig Start Date End Date Taking? Authorizing Provider  atorvastatin  (LIPITOR) 40 MG tablet Take 40 mg by mouth daily. 06/15/19  Yes [provider]  cholecalciferol (VITAMIN D) 1000 units tablet Take 1,000 Units by mouth daily.   Yes [provider]  MOBIC 15 MG tablet Take 15 mg by mouth daily as needed for pain. 03/22/19  Yes [provider]  Multiple Vitamin (MULTIVITAMIN) tablet Take 1 tablet by mouth daily.   Yes [provider]  sildenafil (VIAGRA) 100 MG tablet Take 100 mg by mouth as needed for erectile dysfunction.   Yes [provider]  triamterene-hydrochlorothiazide (DYAZIDE) 37.5-25 MG capsule Take 1 capsule by mouth daily.   Yes [provider]  zinc gluconate 50 MG tablet Take 50 mg by mouth daily.   Yes [provider]    Physical Exam: Constitutional: Moderately built and nourished. Vitals:   07/23/21 2100 07/23/21 2115 07/23/21 2125 07/23/21 2254  BP: 131/85 (!) 139/92  (!) 145/103  Pulse: 61 84  74  Resp: 14 (!) 24  19  Temp:   98.4 F (36.9 C)   TempSrc:   Oral   SpO2: 99% 99%  99%  Weight:   99.8 kg   Height:   5\' 11"  (1.803 m)    Eyes: Anicteric no pallor. ENMT: No discharge from the ears eyes nose and mouth. Neck: No mass felt.  No neck rigidity. Respiratory: No rhonchi or crepitations. Cardiovascular: S1-S2 heard. Abdomen: Soft nontender bowel  sound present. Musculoskeletal: No edema. Skin: No rash. Neurologic: Alert awake oriented time place and person.  Moves all extremities. Psychiatric: Appears normal.  Normal affect.   Labs on Admission: I have personally reviewed following labs and imaging studies  CBC: Recent Labs  Lab 07/23/21 1708 07/23/21 1717  WBC 6.3  --   NEUTROABS 3.8  --   HGB 16.4 17.0  HCT 47.4 50.0  MCV 94.0  --   PLT 160  --    Basic Metabolic Panel: Recent Labs  Lab 07/23/21 1708 07/23/21 1717  NA 137 140  K 4.1 4.1  CL 99 100  CO2 29  --   GLUCOSE 126* 120*  BUN 10 9  CREATININE 0.94 0.80  CALCIUM 9.7  --     GFR: Estimated Creatinine Clearance: 115.2 mL/min (by C-G formula based on SCr of 0.8 mg/dL). Liver Function Tests: Recent Labs  Lab 07/23/21 1708  AST 49*  ALT 43  ALKPHOS 72  BILITOT 0.8  PROT 8.9*  ALBUMIN 4.6   Recent Labs  Lab 07/23/21 1708  LIPASE 58*   No results for input(s): AMMONIA in the last 168 hours. Coagulation Profile: No results for input(s): INR, PROTIME in the last 168 hours. Cardiac Enzymes: No results for input(s): CKTOTAL, CKMB, CKMBINDEX, TROPONINI in the last 168 hours. BNP (last 3 results) No results for input(s): PROBNP in the last 8760 hours. HbA1C: No results for input(s): HGBA1C in the last 72 hours. CBG: No results for input(s): GLUCAP in the last 168 hours. Lipid Profile: No results for input(s): CHOL, HDL, LDLCALC, TRIG, CHOLHDL, LDLDIRECT in the last 72 hours. Thyroid Function Tests: No results for input(s): TSH, T4TOTAL, FREET4, T3FREE, THYROIDAB in the last 72 hours. Anemia Panel: No results for input(s): VITAMINB12, FOLATE, FERRITIN, TIBC, IRON, RETICCTPCT in the last 72 hours. Urine analysis: No results found for: COLORURINE, APPEARANCEUR, LABSPEC, PHURINE, GLUCOSEU, HGBUR, BILIRUBINUR, KETONESUR, PROTEINUR, UROBILINOGEN, NITRITE, LEUKOCYTESUR Sepsis Labs: @LABRCNTIP (procalcitonin:4,lacticidven:4) ) Recent Results (from the past 240 hour(s))  Resp Panel by RT-PCR (Flu A&B, Covid) Nasopharyngeal Swab     Status: None   Collection Time: 07/24/21 12:29 AM   Specimen: Nasopharyngeal Swab; Nasopharyngeal(NP) swabs in vial transport medium  Result Value Ref Range Status   SARS Coronavirus 2 by RT PCR NEGATIVE NEGATIVE Final    Comment: (NOTE) SARS-CoV-2 target nucleic acids are NOT DETECTED.  The SARS-CoV-2 RNA is generally detectable in upper respiratory specimens during the acute phase of infection. The lowest concentration of SARS-CoV-2 viral copies this assay can detect is 138 copies/mL. A negative result does not preclude  SARS-Cov-2 infection and should not be used as the sole basis for treatment or other patient management decisions. A negative result may occur with  improper specimen collection/handling, submission of specimen other than nasopharyngeal swab, presence of viral mutation(s) within the areas targeted by this assay, and inadequate number of viral copies(<138 copies/mL). A negative result must be combined with clinical observations, patient history, and epidemiological information. The expected result is Negative.  Fact Sheet for Patients:  EntrepreneurPulse.com.au  Fact Sheet for Healthcare Providers:  IncredibleEmployment.be  This test is no t yet approved or cleared by the [place] FDA and  has been authorized for detection and/or diagnosis of SARS-CoV-2 by FDA under an Emergency Use Authorization (EUA). This EUA will remain  in effect (meaning this test can be used) for the duration of the COVID-19 declaration under Section 564(b)(1) of the Act, 21 U.S.C.section 360bbb-3(b)(1), unless the authorization is terminated  or revoked sooner.       Influenza A by PCR NEGATIVE NEGATIVE Final   Influenza B by PCR NEGATIVE NEGATIVE Final    Comment: (NOTE) The Xpert Xpress SARS-CoV-2/FLU/RSV plus assay is intended as an aid in the diagnosis of influenza from Nasopharyngeal swab specimens and should not be used as a sole basis for treatment. Nasal washings and aspirates are unacceptable for Xpert Xpress SARS-CoV-2/FLU/RSV testing.  Fact Sheet for Patients: EntrepreneurPulse.com.au  Fact Sheet for Healthcare Providers: IncredibleEmployment.be  This test is not yet approved or cleared by the [place] FDA and has been authorized for detection and/or diagnosis of SARS-CoV-2 by FDA under an Emergency Use Authorization (EUA). This EUA will remain in effect (meaning this test can be used) for the duration of  the COVID-19 declaration under Section 564(b)(1) of the Act, 21 U.S.C. section 360bbb-3(b)(1), unless the authorization is terminated or revoked.  Performed at [facility], [place] [address], [place], [place] [address]      Radiological Exams on Admission: DG Chest 2 View  Result Date: 07/23/2021 CLINICAL DATA:  Syncope, chest pain EXAM: CHEST - 2 VIEW COMPARISON:  None. FINDINGS: The heart size and mediastinal contours are within normal limits. Both lungs are clear. The visualized skeletal structures are unremarkable. IMPRESSION: No active cardiopulmonary disease. Electronically Signed   By: Franchot Gallo M.D.   On: 07/23/2021 17:22   CT Angio Chest/Abd/Pel for Dissection W and/or Wo Contrast  Result Date: 07/23/2021 CLINICAL DATA:  Chest pain, back pain, syncope EXAM: CT ANGIOGRAPHY CHEST, ABDOMEN AND PELVIS TECHNIQUE: Non-contrast CT of the chest was initially obtained. Multidetector CT imaging through the chest, abdomen and pelvis was performed using the standard protocol during bolus administration of intravenous contrast. Multiplanar reconstructed images and MIPs were obtained and reviewed to evaluate the vascular anatomy. RADIATION DOSE REDUCTION: This exam was performed according to the departmental dose-optimization program which includes automated exposure control, adjustment of the mA and/or kV according to patient size and/or use of iterative reconstruction technique. CONTRAST:  121mL OMNIPAQUE IOHEXOL 350 MG/ML SOLN COMPARISON:  07/23/2021 FINDINGS: CTA CHEST FINDINGS Cardiovascular: No evidence of thoracic aortic aneurysm or dissection. Minimal atherosclerosis. The heart is unremarkable without pericardial effusion. While not optimized for opacification of the pulmonary vasculature, there is sufficient contrast enhancement to exclude pulmonary emboli. No filling defects. Mediastinum/Nodes: No enlarged mediastinal, hilar, or axillary lymph nodes. Thyroid gland,  trachea, and esophagus demonstrate no significant findings. Lungs/Pleura: No acute airspace disease, effusion, or pneumothorax. Central airways are patent. Musculoskeletal: No acute or destructive bony lesions. Multilevel thoracic spondylosis. Reconstructed images demonstrate no additional findings. Review of the MIP images confirms the above findings. CTA ABDOMEN AND PELVIS FINDINGS VASCULAR Aorta: Normal caliber aorta without aneurysm, dissection, vasculitis or significant stenosis. Celiac: Patent without evidence of aneurysm, dissection, vasculitis or significant stenosis. SMA: Patent without evidence of aneurysm, dissection, vasculitis or significant stenosis. Renals: Both renal arteries are patent without evidence of aneurysm, dissection, vasculitis, fibromuscular dysplasia or significant stenosis. IMA: Patent without evidence of aneurysm, dissection, vasculitis or significant stenosis. Inflow: Patent without evidence of aneurysm, dissection, vasculitis or significant stenosis. Veins: No obvious venous abnormality within the limitations of this arterial phase study. Review of the MIP images confirms the above findings. NON-VASCULAR Hepatobiliary: No focal liver abnormality is seen. No gallstones, gallbladder wall thickening, or biliary dilatation. Pancreas: Unremarkable. No pancreatic ductal dilatation or surrounding inflammatory changes. Spleen: Normal in size without focal abnormality. Adrenals/Urinary Tract: Adrenal glands are unremarkable. Kidneys are normal, without  renal calculi, focal lesion, or hydronephrosis. Bladder is unremarkable. Stomach/Bowel: No bowel obstruction or ileus. Normal appendix right lower quadrant. No bowel wall thickening or inflammatory change. Lymphatic: No pathologic adenopathy. Reproductive: Prostate is unremarkable. Other: No free fluid or free gas.  No abdominal wall hernia. Musculoskeletal: No acute or destructive bony lesions. Multilevel lumbar spondylosis and facet  hypertrophy, greatest at the lumbosacral junction. Reconstructed images demonstrate no additional findings. Review of the MIP images confirms the above findings. IMPRESSION: 1. No evidence of thoracoabdominal aortic aneurysm or dissection. 2. No evidence of pulmonary embolus. 3. No acute intrathoracic, intra-abdominal, or intrapelvic process. Electronically Signed   By: Randa Ngo M.D.   On: 07/23/2021 18:03    EKG: Independently reviewed.  Normal sinus rhythm with QTC of 440 ms.  Assessment/Plan Principal Problem:   Syncope and collapse Active Problems:   Essential hypertension    Syncope -cause not clear but was precipitated after patient had abdominal discomfort radiating to chest.  We will closely monitor in telemetry for any arrhythmias check 2D echo and likely may need event monitor.  Check orthostatics. Abdominal pain radiating to chest with AST and lipase mildly elevated.  CT scan of the abdomen pelvis was unremarkable.  We will follow LFTs closely monitor.  Abdomen appears benign. History of hypertension we will hold diuretics for now keep patient appears to have a relative check orthostatics follow blood pressure trends.- 2 weeks ago patient was treated for COVID infection as outpatient.  Presently asymptomatic. Hyperlipidemia on statins.  Follow LFTs.   DVT prophylaxis: Lovenox. Code Status: Full code. Family Communication: Discussed with patient. Disposition Plan: Home. Consults called: None. Admission status: Observation.   Rise Patience MD [facility] Pager [phone].  If 7PM-7AM, please contact night-coverage [email or web address] Password [email or web address]  07/24/2021, 1:39 AM

## 2021-07-24 NOTE — Progress Notes (Signed)
° °  Echocardiogram 2D Echocardiogram has been performed.  Festus Barren 07/24/2021, 1:45 PM

## 2021-07-24 NOTE — ED Notes (Addendum)
Pt reports he took his own atorvastatin and BP medication  this morning from home. Pt states this is his first time staying in the hospital and did not know we provided his meds for him. Pt made aware to make RN known about any meds taken from home.

## 2021-07-24 NOTE — ED Notes (Signed)
ECHO at bedside.

## 2021-07-24 NOTE — ED Notes (Signed)
An After Visit Summary was printed and given to the patient. Discharge instructions given and no further questions at this time.  Pt leaving with family. Pt A&Ox4, ambulatory without assistance.

## 2021-07-24 NOTE — ED Notes (Signed)
Pt received lunch tray 

## 2021-07-24 NOTE — Discharge Summary (Signed)
Physician Discharge Summary  Andre Salazar [id number] DOB: 11/08/1960 DOA: 07/23/2021  PCP: Merrilee Seashore, MD  Admit date: 07/23/2021 Discharge date: 07/24/2021  Admitted From: Home Disposition: Home  Recommendations for Outpatient Follow-up:  Follow up with PCP in 1-2 weeks Encourage increased fluid intake  Home Health: No Equipment/Devices: None  Discharge Condition: Stable CODE STATUS: Full code Diet recommendation: Heart healthy diet  History of present illness:  Andre Salazar is a 61 year old male with past medical history significant for essential hypertension, hyperlipidemia presented to [facility] ED on 07/23/2021 following a brief episode of loss of consciousness.  Patient reports he was working and started having abdominal discomfort which radiated to his chest in which he became diaphoretic and passed out.  He roughly may have lost consciousness for about 5 minutes, no reported tongue biting, bowel or bladder incontinence.  No previous similar episodes.  Patient reports some recent diarrhea and 2 weeks ago was treated for COVID-19 infection.  In the ED, temperature 98.5 F, HR 71, RR 18, BP 131/92, SPO2 99% on room air.  Sodium 137, potassium 4.1, chloride 99, CO2 29, glucose 126, BUN 10, creatinine 0.94.  Lipase 58, AST 49, ALT 43, total bilirubin 0.8.  High sensitive troponin 5.  WBC 6.3, hemoglobin 16.4, platelets 160.  Covid-19 PCR negative.  Influenza A/B PCR negative.  Chest x-ray with no active cardiopulmonary disease process.  EKG with normal sinus rhythm, QTC 414 MS, no concerning dynamic changes.  EDP requested hospitalist admission for further evaluation and management of syncopal episode.   Hospital course:  Syncopal episode likely secondary to orthostatic hypotension Patient presenting to the ED following loss of consciousness while at work, no concerning seizure-like activity.  EKG unrevealing and patient was monitored on telemetry with no concerning  arrhythmias.  CT angiogram chest/abdomen/pelvis negative for pulmonary embolism, no thoracoabdominal aortic aneurysm/dissection, no acute intrathoracic, intra-abdominal, intrapelvic process.  Transthoracic echocardiogram with preserved LVEF, no regional wall motion abnormality, no valvular abnormalities.  Orthostatic vital signs were taken that were positive from a seated to standing position with a drop in systolic blood pressure greater than 10.  Patient was given IV fluid hydration with improvement of symptoms.  Encourage patient to maintain hydration.  Outpatient follow-up PCP.  Essential hypertension Continue home triamterene-hydrochlorothiazide 37.5-25 mg p.o. daily.  Hyperlipidemia Continue atorvastatin 40 mg p.o. daily  Discharge Diagnoses:  Active Problems:   Essential hypertension    Discharge Instructions  Discharge Instructions     Call MD for:  difficulty breathing, headache or visual disturbances   Complete by: As directed    Call MD for:  extreme fatigue   Complete by: As directed    Call MD for:  persistant dizziness or light-headedness   Complete by: As directed    Call MD for:  persistant nausea and vomiting   Complete by: As directed    Call MD for:  severe uncontrolled pain   Complete by: As directed    Call MD for:  temperature >100.4   Complete by: As directed    Diet - low sodium heart healthy   Complete by: As directed    Increase activity slowly   Complete by: As directed       Allergies as of 07/24/2021       Reactions   Shellfish Allergy Swelling, Other (See Comments)   Eyes swell   Iodine Swelling, Other (See Comments)   Shellfish allergy and eyes swell        Medication List  TAKE these medications    atorvastatin 40 MG tablet Commonly known as: LIPITOR Take 40 mg by mouth daily.   cholecalciferol 1000 units tablet Commonly known as: VITAMIN D Take 1,000 Units by mouth daily.   Mobic 15 MG tablet Generic drug:  meloxicam Take 15 mg by mouth daily as needed for pain.   multivitamin tablet Take 1 tablet by mouth daily.   sildenafil 100 MG tablet Commonly known as: VIAGRA Take 100 mg by mouth as needed for erectile dysfunction.   triamterene-hydrochlorothiazide 37.5-25 MG capsule Commonly known as: DYAZIDE Take 1 capsule by mouth daily.   zinc gluconate 50 MG tablet Take 50 mg by mouth daily.        Follow-up Information     Georgianne Fick, MD Follow up.   Specialty: Internal Medicine Contact information: [address] [place] [address] [place] [address] [phone]         Georgianne Fick, MD. Schedule an appointment as soon as possible for a visit in 1 week(s).   Specialty: Internal Medicine Contact information: [address] [place] [place] [address] [phone]                Allergies  Allergen Reactions   Shellfish Allergy Swelling and Other (See Comments)    Eyes swell   Iodine Swelling and Other (See Comments)    Shellfish allergy and eyes swell    Consultations: None   Procedures/Studies: DG Chest 2 View  Result Date: 07/23/2021 CLINICAL DATA:  Syncope, chest pain EXAM: CHEST - 2 VIEW COMPARISON:  None. FINDINGS: The heart size and mediastinal contours are within normal limits. Both lungs are clear. The visualized skeletal structures are unremarkable. IMPRESSION: No active cardiopulmonary disease. Electronically Signed   By: Marlan Palau M.D.   On: 07/23/2021 17:22   ECHOCARDIOGRAM COMPLETE BUBBLE STUDY  Result Date: 07/24/2021    ECHOCARDIOGRAM REPORT   Patient Name:   Andre Salazar Date of Exam: 07/24/2021 Medical Rec #:  [id number]            Height:       71.0 in Accession #:    [id number]           Weight:       220.0 lb Date of Birth:  05-May-1961            BSA:          2.196 m Patient Age:    60 years             BP:           125/82 mmHg Patient Gender: M                    HR:           68 bpm. Exam Location:   Inpatient Procedure: 2D Echo, Cardiac Doppler, Color Doppler and Saline Contrast Bubble            Study Indications:    SYNCOPE  History:        Patient has no prior history of Echocardiogram examinations.                 Risk Factors:Hypertension. HLD.  Sonographer:    Festus Barren Referring Phys: [id number] ARSHAD N KAKRAKANDY IMPRESSIONS  1. Left ventricular ejection fraction, by estimation, is 60 to 65%. The left ventricle has normal function. The left ventricle has no regional wall motion abnormalities. Left ventricular diastolic parameters were normal.  2.  Right ventricular systolic function is normal. The right ventricular size is normal.  3. The mitral valve is normal in structure. No evidence of mitral valve regurgitation. No evidence of mitral stenosis.  4. The aortic valve is normal in structure. Aortic valve regurgitation is not visualized. No aortic stenosis is present.  5. The inferior vena cava is normal in size with greater than 50% respiratory variability, suggesting right atrial pressure of 3 mmHg. FINDINGS  Left Ventricle: Left ventricular ejection fraction, by estimation, is 60 to 65%. The left ventricle has normal function. The left ventricle has no regional wall motion abnormalities. The left ventricular internal cavity size was normal in size. There is  no left ventricular hypertrophy. Left ventricular diastolic parameters were normal. Right Ventricle: The right ventricular size is normal. Right ventricular systolic function is normal. Left Atrium: Left atrial size was normal in size. Right Atrium: Right atrial size was normal in size. Pericardium: There is no evidence of pericardial effusion. Mitral Valve: The mitral valve is normal in structure. No evidence of mitral valve regurgitation. No evidence of mitral valve stenosis. Tricuspid Valve: The tricuspid valve is normal in structure. Tricuspid valve regurgitation is trivial. No evidence of tricuspid stenosis. Aortic Valve: The aortic valve is  normal in structure. Aortic valve regurgitation is not visualized. No aortic stenosis is present. Aortic valve mean gradient measures 2.0 mmHg. Aortic valve peak gradient measures 4.6 mmHg. Aortic valve area, by VTI measures 3.99 cm. Pulmonic Valve: The pulmonic valve was grossly normal. Pulmonic valve regurgitation is not visualized. No evidence of pulmonic stenosis. Aorta: The aortic root is normal in size and structure. Venous: The inferior vena cava is normal in size with greater than 50% respiratory variability, suggesting right atrial pressure of 3 mmHg. IAS/Shunts: The interatrial septum was not well visualized.  LEFT VENTRICLE PLAX 2D LVIDd:         3.50 cm     Diastology LVIDs:         1.70 cm     LV e' medial:    12.10 cm/s LV PW:         1.60 cm     LV E/e' medial:  5.4 LV IVS:        1.40 cm     LV e' lateral:   10.80 cm/s LVOT diam:     2.40 cm     LV E/e' lateral: 6.0 LV SV:         81 LV SV Index:   37 LVOT Area:     4.52 cm  LV Volumes (MOD) LV vol d, MOD A2C: 87.3 ml LV vol d, MOD A4C: 78.2 ml LV vol s, MOD A2C: 42.1 ml LV vol s, MOD A4C: 58.3 ml LV SV MOD A2C:     45.2 ml LV SV MOD A4C:     78.2 ml LV SV MOD BP:      34.0 ml RIGHT VENTRICLE             IVC RV S prime:     17.80 cm/s  IVC diam: 1.60 cm TAPSE (M-mode): 1.8 cm LEFT ATRIUM             Index        RIGHT ATRIUM           Index LA diam:        2.70 cm 1.23 cm/m   RA Area:     13.10 cm LA Vol (A2C):   50.0 ml 22.77 ml/m  RA Volume:   28.40 ml  12.93 ml/m LA Vol (A4C):   22.8 ml 10.38 ml/m LA Biplane Vol: 35.0 ml 15.94 ml/m  AORTIC VALVE                    PULMONIC VALVE AV Area (Vmax):    3.84 cm     PV Vmax:       0.73 m/s AV Area (Vmean):   3.74 cm     PV Vmean:      45.600 cm/s AV Area (VTI):     3.99 cm     PV VTI:        0.141 m AV Vmax:           107.00 cm/s  PV Peak grad:  2.1 mmHg AV Vmean:          70.600 cm/s  PV Mean grad:  1.0 mmHg AV VTI:            0.203 m AV Peak Grad:      4.6 mmHg AV Mean Grad:      2.0 mmHg  LVOT Vmax:         90.80 cm/s LVOT Vmean:        58.300 cm/s LVOT VTI:          0.179 m LVOT/AV VTI ratio: 0.88  AORTA Ao Root diam: 3.30 cm Ao Asc diam:  2.60 cm MITRAL VALVE MV Area (PHT): 2.80 cm    SHUNTS MV Decel Time: 271 msec    Systemic VTI:  0.18 m MV E velocity: 65.10 cm/s  Systemic Diam: 2.40 cm MV A velocity: 72.00 cm/s MV E/A ratio:  0.90 Kirk Ruths MD Electronically signed by Kirk Ruths MD Signature Date/Time: 07/24/2021/2:44:16 PM    Final    CT Angio Chest/Abd/Pel for Dissection W and/or Wo Contrast  Result Date: 07/23/2021 CLINICAL DATA:  Chest pain, back pain, syncope EXAM: CT ANGIOGRAPHY CHEST, ABDOMEN AND PELVIS TECHNIQUE: Non-contrast CT of the chest was initially obtained. Multidetector CT imaging through the chest, abdomen and pelvis was performed using the standard protocol during bolus administration of intravenous contrast. Multiplanar reconstructed images and MIPs were obtained and reviewed to evaluate the vascular anatomy. RADIATION DOSE REDUCTION: This exam was performed according to the departmental dose-optimization program which includes automated exposure control, adjustment of the mA and/or kV according to patient size and/or use of iterative reconstruction technique. CONTRAST:  192mL OMNIPAQUE IOHEXOL 350 MG/ML SOLN COMPARISON:  07/23/2021 FINDINGS: CTA CHEST FINDINGS Cardiovascular: No evidence of thoracic aortic aneurysm or dissection. Minimal atherosclerosis. The heart is unremarkable without pericardial effusion. While not optimized for opacification of the pulmonary vasculature, there is sufficient contrast enhancement to exclude pulmonary emboli. No filling defects. Mediastinum/Nodes: No enlarged mediastinal, hilar, or axillary lymph nodes. Thyroid gland, trachea, and esophagus demonstrate no significant findings. Lungs/Pleura: No acute airspace disease, effusion, or pneumothorax. Central airways are patent. Musculoskeletal: No acute or destructive bony lesions.  Multilevel thoracic spondylosis. Reconstructed images demonstrate no additional findings. Review of the MIP images confirms the above findings. CTA ABDOMEN AND PELVIS FINDINGS VASCULAR Aorta: Normal caliber aorta without aneurysm, dissection, vasculitis or significant stenosis. Celiac: Patent without evidence of aneurysm, dissection, vasculitis or significant stenosis. SMA: Patent without evidence of aneurysm, dissection, vasculitis or significant stenosis. Renals: Both renal arteries are patent without evidence of aneurysm, dissection, vasculitis, fibromuscular dysplasia or significant stenosis. IMA: Patent without evidence of aneurysm, dissection, vasculitis or significant stenosis. Inflow: Patent without evidence of aneurysm, dissection, vasculitis  or significant stenosis. Veins: No obvious venous abnormality within the limitations of this arterial phase study. Review of the MIP images confirms the above findings. NON-VASCULAR Hepatobiliary: No focal liver abnormality is seen. No gallstones, gallbladder wall thickening, or biliary dilatation. Pancreas: Unremarkable. No pancreatic ductal dilatation or surrounding inflammatory changes. Spleen: Normal in size without focal abnormality. Adrenals/Urinary Tract: Adrenal glands are unremarkable. Kidneys are normal, without renal calculi, focal lesion, or hydronephrosis. Bladder is unremarkable. Stomach/Bowel: No bowel obstruction or ileus. Normal appendix right lower quadrant. No bowel wall thickening or inflammatory change. Lymphatic: No pathologic adenopathy. Reproductive: Prostate is unremarkable. Other: No free fluid or free gas.  No abdominal wall hernia. Musculoskeletal: No acute or destructive bony lesions. Multilevel lumbar spondylosis and facet hypertrophy, greatest at the lumbosacral junction. Reconstructed images demonstrate no additional findings. Review of the MIP images confirms the above findings. IMPRESSION: 1. No evidence of thoracoabdominal aortic  aneurysm or dissection. 2. No evidence of pulmonary embolus. 3. No acute intrathoracic, intra-abdominal, or intrapelvic process. Electronically Signed   By: Randa Ngo M.D.   On: 07/23/2021 18:03     Subjective: Patient seen examined bedside, resting comfortably.  No complaints.  Work-up unrevealing other than mild orthostasis.  Encourage patient to maintain hydration.  Patient states ready for discharge home.  No other complaints or concerns at this time.  Denies headache, no dizziness, no chest pain, palpitations, no shortness of breath, no abdominal pain, no fever/chills/night sweats, no nausea/vomiting/diarrhea, no weakness, no fatigue, no paresthesias.  No acute events overnight per staff.  Discharge Exam: Vitals:   07/24/21 1403 07/24/21 1430  BP:  137/82  Pulse: 67 70  Resp: 16 17  Temp:    SpO2: 99% 98%   Vitals:   07/24/21 1300 07/24/21 1343 07/24/21 1403 07/24/21 1430  BP: 133/85   137/82  Pulse: 76 76 67 70  Resp: 19 18 16 17   Temp:      TempSrc:      SpO2: 99% 98% 99% 98%  Weight:      Height:        General: Pt is alert, awake, not in acute distress Cardiovascular: RRR, S1/S2 +, no rubs, no gallops Respiratory: CTA bilaterally, no wheezing, no rhonchi, on room air Abdominal: Soft, NT, ND, bowel sounds + Extremities: no edema, no cyanosis    The results of significant diagnostics from this hospitalization (including imaging, microbiology, ancillary and laboratory) are listed below for reference.     Microbiology: Recent Results (from the past 240 hour(s))  Resp Panel by RT-PCR (Flu A&B, Covid) Nasopharyngeal Swab     Status: None   Collection Time: 07/24/21 12:29 AM   Specimen: Nasopharyngeal Swab; Nasopharyngeal(NP) swabs in vial transport medium  Result Value Ref Range Status   SARS Coronavirus 2 by RT PCR NEGATIVE NEGATIVE Final    Comment: (NOTE) SARS-CoV-2 target nucleic acids are NOT DETECTED.  The SARS-CoV-2 RNA is generally detectable in upper  respiratory specimens during the acute phase of infection. The lowest concentration of SARS-CoV-2 viral copies this assay can detect is 138 copies/mL. A negative result does not preclude SARS-Cov-2 infection and should not be used as the sole basis for treatment or other patient management decisions. A negative result may occur with  improper specimen collection/handling, submission of specimen other than nasopharyngeal swab, presence of viral mutation(s) within the areas targeted by this assay, and inadequate number of viral copies(<138 copies/mL). A negative result must be combined with clinical observations, patient history, and epidemiological information. The  expected result is Negative.  Fact Sheet for Patients:  EntrepreneurPulse.com.au  Fact Sheet for Healthcare Providers:  IncredibleEmployment.be  This test is no t yet approved or cleared by the [place] FDA and  has been authorized for detection and/or diagnosis of SARS-CoV-2 by FDA under an Emergency Use Authorization (EUA). This EUA will remain  in effect (meaning this test can be used) for the duration of the COVID-19 declaration under Section 564(b)(1) of the Act, 21 U.S.C.section 360bbb-3(b)(1), unless the authorization is terminated  or revoked sooner.       Influenza A by PCR NEGATIVE NEGATIVE Final   Influenza B by PCR NEGATIVE NEGATIVE Final    Comment: (NOTE) The Xpert Xpress SARS-CoV-2/FLU/RSV plus assay is intended as an aid in the diagnosis of influenza from Nasopharyngeal swab specimens and should not be used as a sole basis for treatment. Nasal washings and aspirates are unacceptable for Xpert Xpress SARS-CoV-2/FLU/RSV testing.  Fact Sheet for Patients: EntrepreneurPulse.com.au  Fact Sheet for Healthcare Providers: IncredibleEmployment.be  This test is not yet approved or cleared by the [place] FDA and has been  authorized for detection and/or diagnosis of SARS-CoV-2 by FDA under an Emergency Use Authorization (EUA). This EUA will remain in effect (meaning this test can be used) for the duration of the COVID-19 declaration under Section 564(b)(1) of the Act, 21 U.S.C. section 360bbb-3(b)(1), unless the authorization is terminated or revoked.  Performed at [facility], [place] [address] [place],  [address]      Labs: BNP (last 3 results) No results for input(s): BNP in the last 8760 hours. Basic Metabolic Panel: Recent Labs  Lab 07/23/21 1708 07/23/21 1717 07/24/21 0520  NA 137 140 138  K 4.1 4.1 3.6  CL 99 100 101  CO2 29  --  28  GLUCOSE 126* 120* 106*  BUN 10 9 12   CREATININE 0.94 0.80 0.83  CALCIUM 9.7  --  9.5  MG  --   --  2.4   Liver Function Tests: Recent Labs  Lab 07/23/21 1708  AST 49*  ALT 43  ALKPHOS 72  BILITOT 0.8  PROT 8.9*  ALBUMIN 4.6   Recent Labs  Lab 07/23/21 1708  LIPASE 58*   No results for input(s): AMMONIA in the last 168 hours. CBC: Recent Labs  Lab 07/23/21 1708 07/23/21 1717 07/24/21 0520  WBC 6.3  --  6.8  NEUTROABS 3.8  --   --   HGB 16.4 17.0 15.1  HCT 47.4 50.0 43.3  MCV 94.0  --  93.5  PLT 160  --  147*   Cardiac Enzymes: No results for input(s): CKTOTAL, CKMB, CKMBINDEX, TROPONINI in the last 168 hours. BNP: Invalid input(s): POCBNP CBG: No results for input(s): GLUCAP in the last 168 hours. D-Dimer Recent Labs    07/23/21 2253  DDIMER 0.59*   Hgb A1c No results for input(s): HGBA1C in the last 72 hours. Lipid Profile No results for input(s): CHOL, HDL, LDLCALC, TRIG, CHOLHDL, LDLDIRECT in the last 72 hours. Thyroid function studies No results for input(s): TSH, T4TOTAL, T3FREE, THYROIDAB in the last 72 hours.  Invalid input(s): FREET3 Anemia work up No results for input(s): VITAMINB12, FOLATE, FERRITIN, TIBC, IRON, RETICCTPCT in the last 72 hours. Urinalysis No results found for:  COLORURINE, APPEARANCEUR, LABSPEC, [place], GLUCOSEU, [place], BILIRUBINUR, KETONESUR, PROTEINUR, UROBILINOGEN, NITRITE, LEUKOCYTESUR Sepsis Labs Invalid input(s): PROCALCITONIN,  WBC,  LACTICIDVEN Microbiology Recent Results (from the past 240 hour(s))  Resp Panel by RT-PCR (Flu A&B, Covid)  Nasopharyngeal Swab     Status: None   Collection Time: 07/24/21 12:29 AM   Specimen: Nasopharyngeal Swab; Nasopharyngeal(NP) swabs in vial transport medium  Result Value Ref Range Status   SARS Coronavirus 2 by RT PCR NEGATIVE NEGATIVE Final    Comment: (NOTE) SARS-CoV-2 target nucleic acids are NOT DETECTED.  The SARS-CoV-2 RNA is generally detectable in upper respiratory specimens during the acute phase of infection. The lowest concentration of SARS-CoV-2 viral copies this assay can detect is 138 copies/mL. A negative result does not preclude SARS-Cov-2 infection and should not be used as the sole basis for treatment or other patient management decisions. A negative result may occur with  improper specimen collection/handling, submission of specimen other than nasopharyngeal swab, presence of viral mutation(s) within the areas targeted by this assay, and inadequate number of viral copies(<138 copies/mL). A negative result must be combined with clinical observations, patient history, and epidemiological information. The expected result is Negative.  Fact Sheet for Patients:  EntrepreneurPulse.com.au  Fact Sheet for Healthcare Providers:  IncredibleEmployment.be  This test is no t yet approved or cleared by the [place] FDA and  has been authorized for detection and/or diagnosis of SARS-CoV-2 by FDA under an Emergency Use Authorization (EUA). This EUA will remain  in effect (meaning this test can be used) for the duration of the COVID-19 declaration under Section 564(b)(1) of the Act, 21 U.S.C.section 360bbb-3(b)(1), unless the authorization is  terminated  or revoked sooner.       Influenza A by PCR NEGATIVE NEGATIVE Final   Influenza B by PCR NEGATIVE NEGATIVE Final    Comment: (NOTE) The Xpert Xpress SARS-CoV-2/FLU/RSV plus assay is intended as an aid in the diagnosis of influenza from Nasopharyngeal swab specimens and should not be used as a sole basis for treatment. Nasal washings and aspirates are unacceptable for Xpert Xpress SARS-CoV-2/FLU/RSV testing.  Fact Sheet for Patients: EntrepreneurPulse.com.au  Fact Sheet for Healthcare Providers: IncredibleEmployment.be  This test is not yet approved or cleared by the [place] FDA and has been authorized for detection and/or diagnosis of SARS-CoV-2 by FDA under an Emergency Use Authorization (EUA). This EUA will remain in effect (meaning this test can be used) for the duration of the COVID-19 declaration under Section 564(b)(1) of the Act, 21 U.S.C. section 360bbb-3(b)(1), unless the authorization is terminated or revoked.  Performed at [facility], [place] [address], [place], [place] [address]      Time coordinating discharge: Over 30 minutes  SIGNED:   Ashly Goethe J [place] DO  [facility] 07/24/2021, 3:23 PM

## 2021-07-24 NOTE — ED Notes (Signed)
Per Dr. [place] pts discharge papers/instructions area ready to give to patient, pt is to be discharged.

## 2022-02-04 ENCOUNTER — Other Ambulatory Visit: Payer: Self-pay | Admitting: Podiatry

## 2022-02-04 DIAGNOSIS — M7662 Achilles tendinitis, left leg: Secondary | ICD-10-CM

## 2022-03-02 ENCOUNTER — Other Ambulatory Visit: Payer: 59

## 2022-12-28 ENCOUNTER — Other Ambulatory Visit: Payer: Self-pay | Admitting: Internal Medicine

## 2022-12-28 DIAGNOSIS — R002 Palpitations: Secondary | ICD-10-CM

## 2022-12-28 DIAGNOSIS — E785 Hyperlipidemia, unspecified: Secondary | ICD-10-CM

## 2023-01-14 ENCOUNTER — Ambulatory Visit (HOSPITAL_COMMUNITY)
Admission: RE | Admit: 2023-01-14 | Discharge: 2023-01-14 | Disposition: A | Discharge status: Home / Self Care | Payer: 59 | Source: Ambulatory Visit | Attending: Internal Medicine | Admitting: Internal Medicine

## 2023-01-14 DIAGNOSIS — R002 Palpitations: Secondary | ICD-10-CM | POA: Insufficient documentation

## 2023-01-14 DIAGNOSIS — E785 Hyperlipidemia, unspecified: Secondary | ICD-10-CM | POA: Insufficient documentation

## 2023-02-15 ENCOUNTER — Ambulatory Visit: Payer: PRIVATE HEALTH INSURANCE | Admitting: Cardiology

## 2023-02-15 ENCOUNTER — Encounter: Payer: Self-pay | Admitting: Cardiology

## 2023-02-15 VITALS — BP 137/80 | HR 68 | Resp 16 | Ht 71.0 in | Wt 250.0 lb

## 2023-02-15 DIAGNOSIS — R7303 Prediabetes: Secondary | ICD-10-CM

## 2023-02-15 DIAGNOSIS — G4730 Sleep apnea, unspecified: Secondary | ICD-10-CM

## 2023-02-15 DIAGNOSIS — R931 Abnormal findings on diagnostic imaging of heart and coronary circulation: Secondary | ICD-10-CM

## 2023-02-15 DIAGNOSIS — E6609 Other obesity due to excess calories: Secondary | ICD-10-CM

## 2023-02-15 DIAGNOSIS — E782 Mixed hyperlipidemia: Secondary | ICD-10-CM

## 2023-02-15 DIAGNOSIS — R002 Palpitations: Secondary | ICD-10-CM

## 2023-02-15 DIAGNOSIS — I10 Essential (primary) hypertension: Secondary | ICD-10-CM

## 2023-02-15 NOTE — Progress Notes (Signed)
ID:  Andre Salazar, DOB 1961-06-27, MRN [id number]  PCP:  Georgianne Fick, MD  Cardiologist:  Tessa Lerner, DO, [facility] (established care 02/15/23)  REASON FOR CONSULT: Coronary artery calcification.   REQUESTING PHYSICIAN:  Georgianne Fick, MD [address] [place],  [place] [address]  Chief Complaint  Patient presents with   Palpitations   New Patient (Initial Visit)    HPI  Andre Salazar is a 62 y.o. African-American male who presents to the clinic for evaluation of coronary artery calcification and palpitations at the request of Georgianne Fick, MD. His past medical history and cardiovascular risk factors include: Mild CAC, erectile dysfunction, hypertension, hyperlipidemia, prediabetes, nonalcoholic fatty liver disease, sleep apnea not on CPAP.  Patient was referred to the practice for evaluation of palpitations and coronary artery calcification.  Palpitations: Has had couple episodes this past 1 year.   Had mentioned this to his PCP at his annual well visit who recommended a coronary calcium score and cardiology consult. Patient states that during these episodes of palpitation he wakes up in the middle of the night. Episodes are short-lived. Not associated with near-syncope or syncopal events. Has not noticed them during the daytime. He does have a prior history of sleep apnea but currently not on device therapy. May consume 1 glass of wine and 2 beers per day.  Denies the consumption of coffee, soda, illicit drugs, energy drinks, supplements, weight loss medications.  Last episode of syncope was at least 2 years ago when he was dehydrated when he had COVID.  No reoccurrence  His most recent coronary calcification notes mild CAC.  Denies anginal chest pain or heart failure symptoms.  No family history of premature coronary disease or sudden cardiac death.  FUNCTIONAL STATUS: On a regular basis enjoys doing 100 jumping jacks at a time,  walks at least 1 mile a day, and does free weights as well.  CARDIAC DATABASE: EKG: February 15, 2023: Sinus rhythm, 72 bpm, rare PVCs, without underlying injury pattern.  Echocardiogram: No results found for this or any previous visit from the past 1095 days.   Stress Testing: No results found for this or any previous visit from the past 1095 days.  CT Cardiac Scoring: January 14, 2023 Left main 0. LAD 34.4. LCx 0. RCA 0. Total CAC 34.4 AU, 29th percentile for patient's age, sex, and race. Noncardiac findings: Punctate calcification in the aortic root.  No acute extracardiac abnormalities.   ALLERGIES: Allergies  Allergen Reactions   Shellfish Allergy Swelling and Other (See Comments)    Eyes swell   Iodine Swelling and Other (See Comments)    Shellfish allergy and eyes swell    MEDICATION LIST PRIOR TO VISIT: Current Meds  Medication Sig   atorvastatin (LIPITOR) 40 MG tablet Take 40 mg by mouth daily.   Multiple Vitamin (MULTIVITAMIN) tablet Take 1 tablet by mouth daily.   sildenafil (VIAGRA) 100 MG tablet Take 100 mg by mouth as needed for erectile dysfunction.   triamterene-hydrochlorothiazide (DYAZIDE) 37.5-25 MG capsule Take 1 capsule by mouth daily.     PAST MEDICAL HISTORY: Past Medical History:  Diagnosis Date   Hyperlipidemia    Hypertension     PAST SURGICAL HISTORY: Past Surgical History:  Procedure Laterality Date   COLONOSCOPY  2015   NASAL SINUS SURGERY  1980    FAMILY HISTORY: The patient family history includes Alcohol abuse in his father; Cancer in his sister; Cirrhosis in his father; Congestive Heart Failure in his mother;  Hypertension in his brother, brother, sister, and another family member.  SOCIAL HISTORY:  The patient  reports that he has never smoked. He has never used smokeless tobacco. He reports current alcohol use. He reports that he does not use drugs.  REVIEW OF SYSTEMS: Review of Systems  Cardiovascular:  Positive for  palpitations. Negative for chest pain, claudication, dyspnea on exertion, irregular heartbeat, leg swelling, near-syncope, orthopnea, paroxysmal nocturnal dyspnea and syncope.  Respiratory:  Negative for shortness of breath.   Hematologic/Lymphatic: Negative for bleeding problem.  Musculoskeletal:  Negative for muscle cramps and myalgias.  Neurological:  Negative for dizziness and light-headedness.    PHYSICAL EXAM:    02/15/2023   12:58 PM 07/24/2021    2:30 PM 07/24/2021    2:03 PM  Vitals with BMI  Height 5\' 11"     Weight 250 lbs    BMI 34.88    Systolic 137 137   Diastolic 80 82   Pulse 68 70 67    Physical Exam  Constitutional: No distress.  Age appropriate, hemodynamically stable.   Neck: No JVD present.  Cardiovascular: Normal rate, regular rhythm, S1 normal, S2 normal, intact distal pulses and normal pulses. Exam reveals no gallop, no S3 and no S4.  No murmur heard. Pulmonary/Chest: Effort normal and breath sounds normal. No stridor. He has no wheezes. He has no rales.  Abdominal: Soft. Bowel sounds are normal. He exhibits no distension. There is no abdominal tenderness.  Musculoskeletal:        General: No edema.     Cervical back: Neck supple.  Neurological: He is alert and oriented to person, place, and time. He has intact cranial nerves (2-12).  Skin: Skin is warm and moist.   LABORATORY DATA: External Labs: Collected: December 21, 2022 provided by PCP. Hemoglobin 15.2 g/dL BUN 15, creatinine 1.61. Sodium 139, potassium 4.6, chloride 101, bicarb 24. AST 41, ALT 40, alkaline phosphatase 72. A1c 6.7. Total cholesterol 171, triglycerides 140, HDL 60, LDL calculated 87 TSH 3.0.  IMPRESSION:    ICD-10-CM   1. Palpitations  R00.2 EKG 12-Lead    LONG TERM MONITOR (3-14 DAYS)    2. Agatston coronary artery calcium score less than 100  R93.1 PCV ECHOCARDIOGRAM COMPLETE    PCV CARDIAC STRESS TEST    LONG TERM MONITOR (3-14 DAYS)    3. Benign hypertension  I10 PCV  ECHOCARDIOGRAM COMPLETE    PCV CARDIAC STRESS TEST    4. Mixed hyperlipidemia  E78.2     5. Prediabetes  R73.03     6. Sleep apnea in adult  G47.30     7. Class 1 obesity due to excess calories without serious comorbidity with body mass index (BMI) of 34.0 to 34.9 in adult  E66.09    Z68.34        RECOMMENDATIONS: Andre Salazar is a 62 y.o. African-American male whose past medical history and cardiac risk factors include: Mild CAC, erectile dysfunction, hypertension, hyperlipidemia, prediabetes, nonalcoholic fatty liver disease, sleep apnea not on CPAP.  Palpitations Clinically asymptomatic. Differential diagnosis includes untreated sleep apnea, more than the recommended amount of alcohol consumption, sleep related disorders, or vivid dreams. EKG does show sinus rhythm with rare PVCs.   Patient states that he has been told that he has extra beats in the past as well. Given his symptoms and EKG findings we will proceed with a Zio patch to quantify the severity of PVC burden.  Agatston coronary artery calcium score less than 100 Total CAC  34.4, 29th percentile. Already on statin therapy.  Recommend a goal LDL at least <70 mg/dL. Will hold off on aspirin at this time as he is <50th percentile for his age matched cohorts. Reemphasized importance of improving his modifiable cardiovascular risk factors  Benign hypertension Office blood pressures acceptable. Currently managed by primary care provider.  Mixed hyperlipidemia Currently on atorvastatin.   He denies myalgia or other side effects. Most recent lipids dated July 2024, independently reviewed as noted above. Recommend a goal LDL <70 mg/dL. I am hopeful that he will reach his goal after increasing physical activity/losing weight.  If the numbers are still not well-controlled on repeat check up titration of medical therapy could be considered.  Patient is agreeable with the plan of care Currently managed by primary care  provider.  Sleep apnea in adult Has a history of sleep apnea. Currently not on device therapy. Consider reevaluation-will defer to PCP  Class 1 obesity due to excess calories without serious comorbidity with body mass index (BMI) of 34.0 to 34.9 in adult Body mass index is 34.87 kg/m. I reviewed with him importance of diet, regular physical activity/exercise, weight loss.   Patient is educated on the importance of increasing physical activity gradually as tolerated with a goal of moderate intensity exercise for 30 minutes a day 5 days a week.   FINAL MEDICATION LIST END OF ENCOUNTER: No orders of the defined types were placed in this encounter.   Medications Discontinued During This Encounter  Medication Reason   cholecalciferol (VITAMIN D) 1000 units tablet    MOBIC 15 MG tablet    zinc gluconate 50 MG tablet      Current Outpatient Medications:    atorvastatin (LIPITOR) 40 MG tablet, Take 40 mg by mouth daily., Disp: , Rfl:    Multiple Vitamin (MULTIVITAMIN) tablet, Take 1 tablet by mouth daily., Disp: , Rfl:    sildenafil (VIAGRA) 100 MG tablet, Take 100 mg by mouth as needed for erectile dysfunction., Disp: , Rfl:    triamterene-hydrochlorothiazide (DYAZIDE) 37.5-25 MG capsule, Take 1 capsule by mouth daily., Disp: , Rfl:   Orders Placed This Encounter  Procedures   PCV CARDIAC STRESS TEST   LONG TERM MONITOR (3-14 DAYS)   EKG 12-Lead   PCV ECHOCARDIOGRAM COMPLETE    There are no Patient Instructions on file for this visit.   --Continue cardiac medications as reconciled in final medication list. --Return in about 8 weeks (around 04/12/2023) for Follow up, Coronary artery calcification, Palpitations. or sooner if needed. --Continue follow-up with your primary care physician regarding the management of your other chronic comorbid conditions.  Patient's questions and concerns were addressed to his satisfaction. He voices understanding of the instructions provided during  this encounter.   This note was created using a voice recognition software as a result there may be grammatical errors inadvertently enclosed that do not reflect the nature of this encounter. Every attempt is made to correct such errors.  Tessa Lerner, [place], [facility]  Pager:  [phone] Office: [phone]

## 2023-03-05 ENCOUNTER — Ambulatory Visit: Payer: PRIVATE HEALTH INSURANCE

## 2023-03-05 ENCOUNTER — Other Ambulatory Visit: Payer: PRIVATE HEALTH INSURANCE

## 2023-03-05 DIAGNOSIS — R931 Abnormal findings on diagnostic imaging of heart and coronary circulation: Secondary | ICD-10-CM

## 2023-03-05 DIAGNOSIS — R002 Palpitations: Secondary | ICD-10-CM

## 2023-03-05 DIAGNOSIS — I10 Essential (primary) hypertension: Secondary | ICD-10-CM

## 2023-04-06 IMAGING — CT CT ANGIO CHEST-ABD-PELV FOR DISSECTION W/ AND WO/W CM
2 of 7 series · 15 of 46 positions shown, 17 images · non-contrast
Comparison: 07/23/2021

CLINICAL DATA: Chest pain, back pain, syncope

EXAM:
CT ANGIOGRAPHY CHEST, ABDOMEN AND PELVIS
TECHNIQUE: Non-contrast CT of the chest was initially obtained.

[Series 6: axial arterial · axial · arterial · 0.86mm/px · z∈[-503,+70]mm · 12 of 221 slices shown, 14 images]
[im 15/221  soft-tissue]
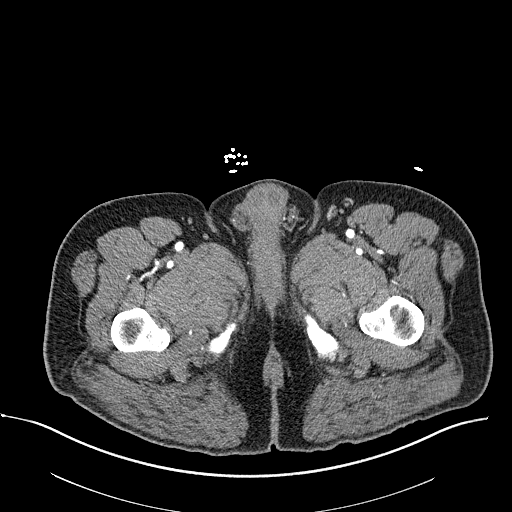
[im 15/221  bone]
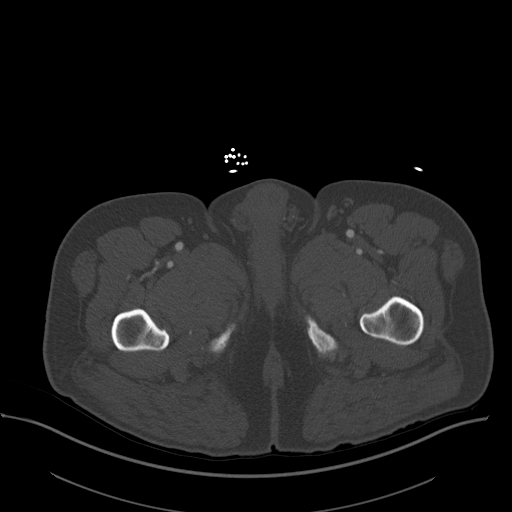
[im 30/221  soft-tissue]
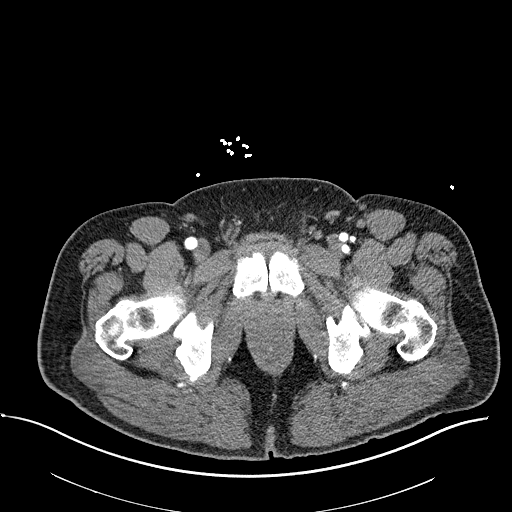
[im 45/221  soft-tissue]
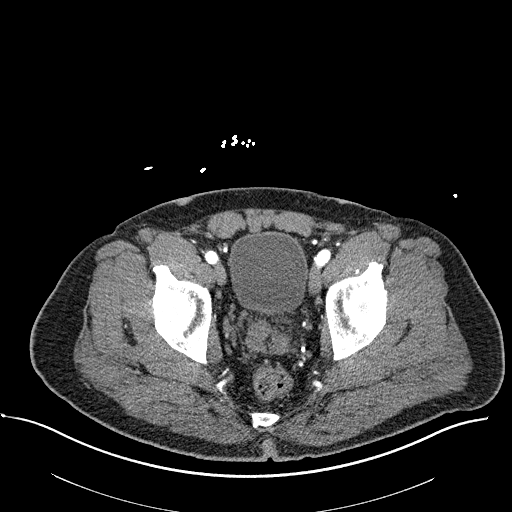
[im 74/221  soft-tissue]
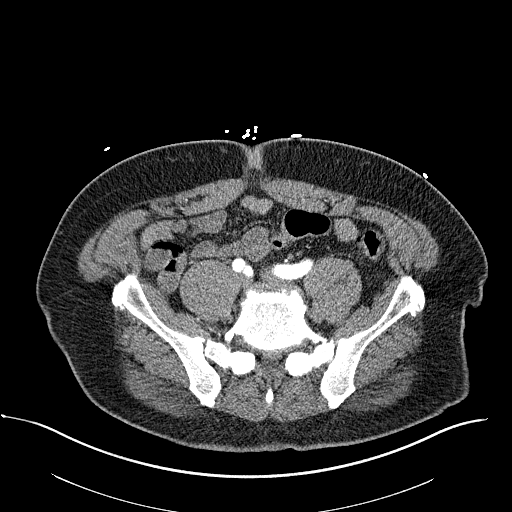
[im 89/221  soft-tissue]
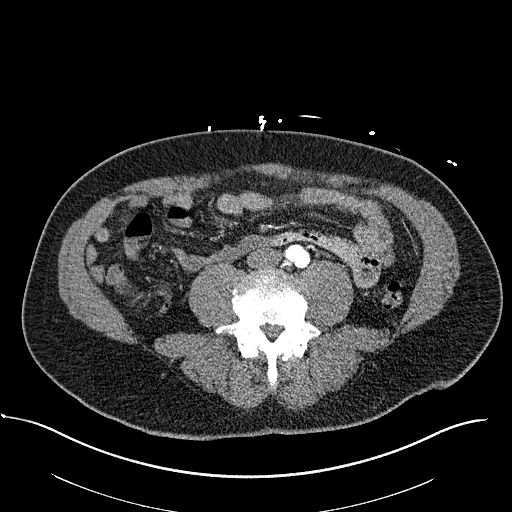
[im 103/221  soft-tissue]
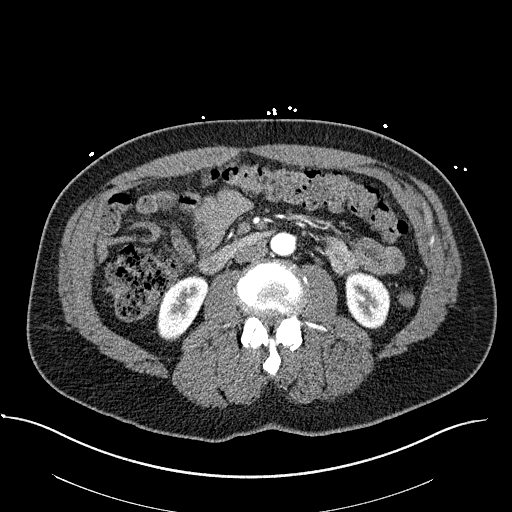
[im 118/221  soft-tissue]
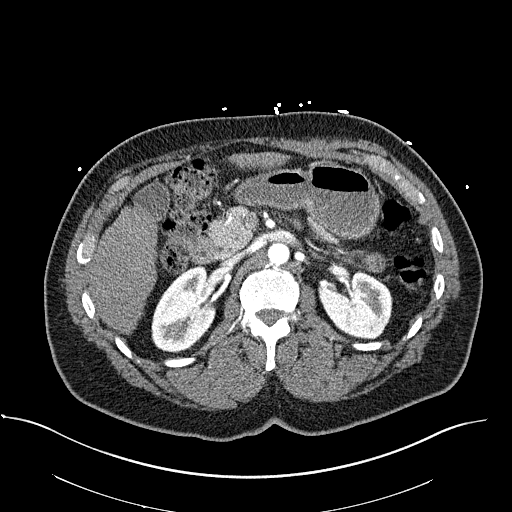
[im 133/221  soft-tissue]
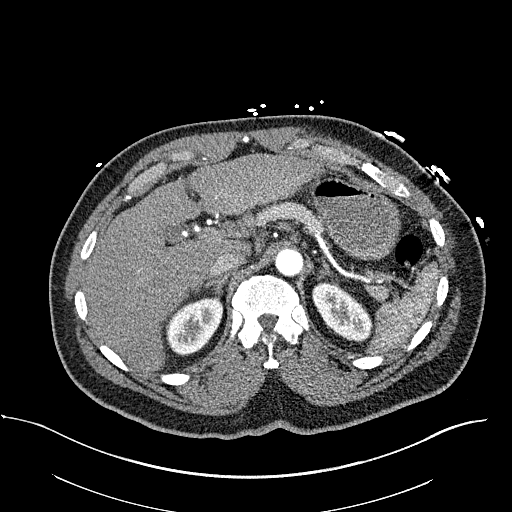
[im 147/221  soft-tissue]
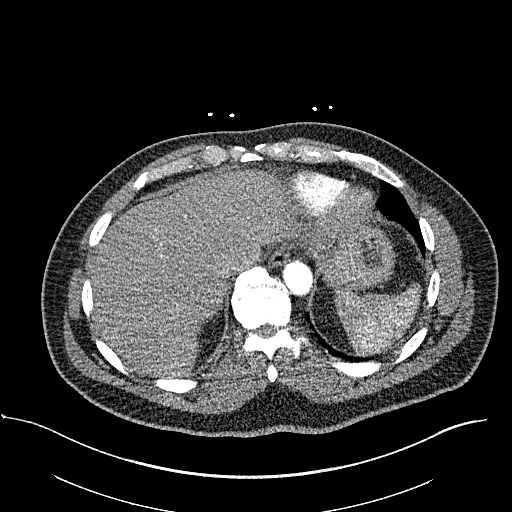
[im 147/221  bone]
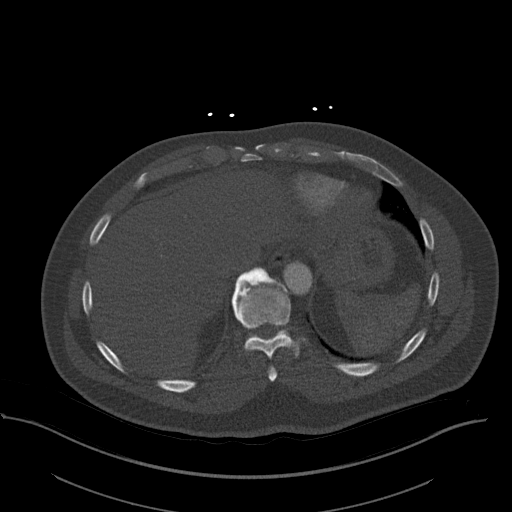
[im 177/221  soft-tissue]
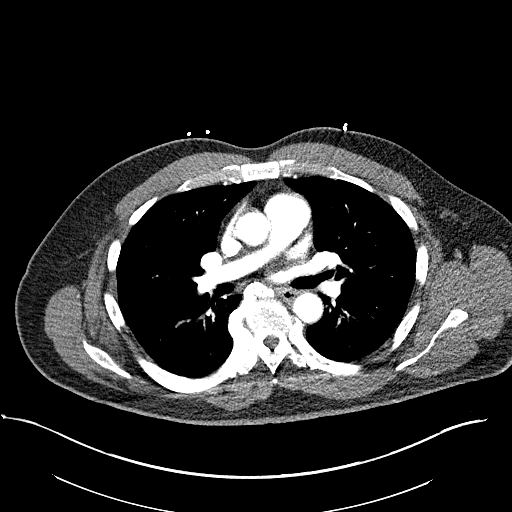
[im 191/221  soft-tissue]
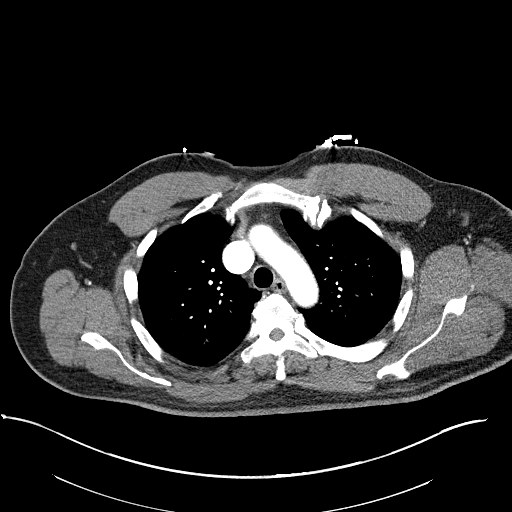
[im 206/221  soft-tissue]
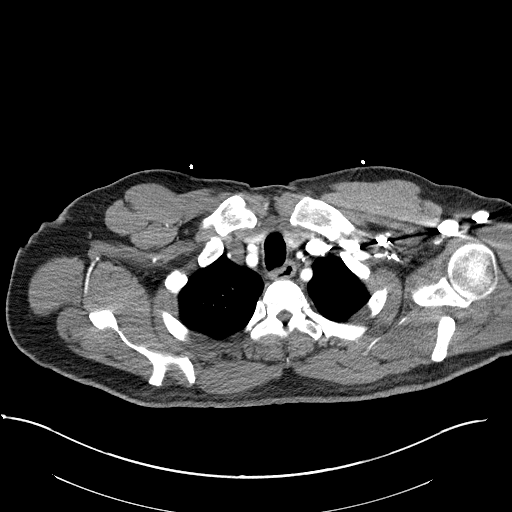

[Series 7: coronals · coronal · 1.07mm/px · 3 of 146 slices shown]
[im 37/146  soft-tissue]
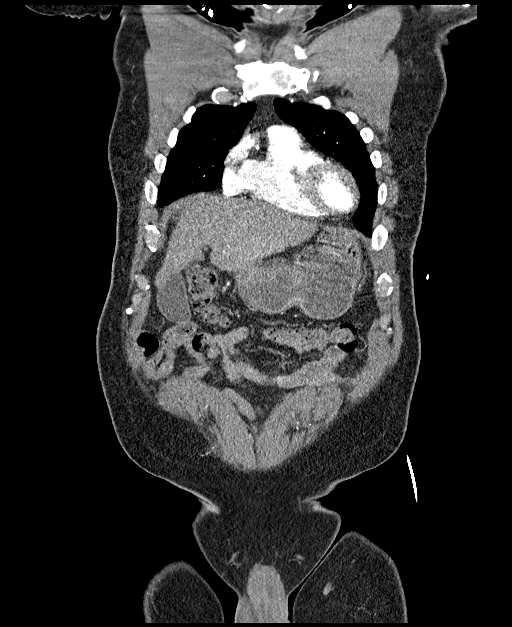
[im 73/146  soft-tissue]
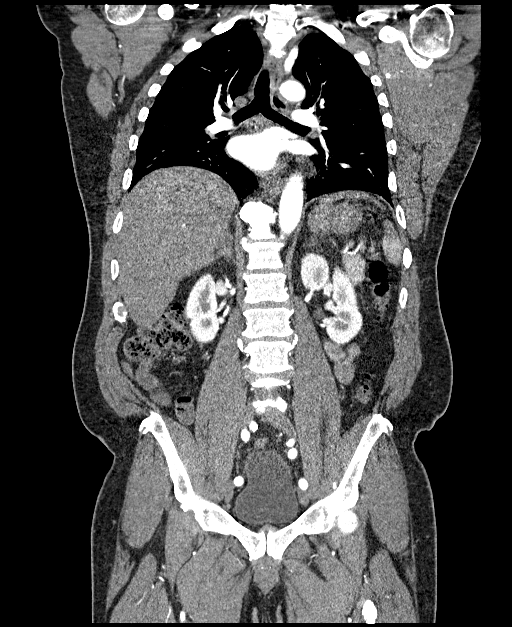
[im 109/146  soft-tissue]
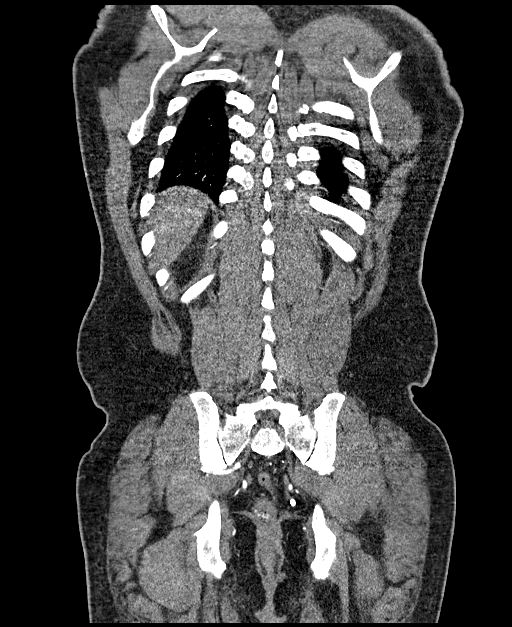

[15 of 46 positions shown; findings below may reference images not displayed]

Multidetector CT imaging through the chest, abdomen and pelvis was
performed using the standard protocol during bolus administration of
intravenous contrast. Multiplanar reconstructed images and MIPs were
obtained and reviewed to evaluate the vascular anatomy.

RADIATION DOSE REDUCTION: This exam was performed according to the
departmental dose-optimization program which includes automated
exposure control, adjustment of the mA and/or kV according to
patient size and/or use of iterative reconstruction technique.

CONTRAST:  100mL OMNIPAQUE IOHEXOL 350 MG/ML SOLN
FINDINGS: CTA CHEST FINDINGS

Cardiovascular: No evidence of thoracic aortic aneurysm or
dissection. Minimal atherosclerosis.

The heart is unremarkable without pericardial effusion. While not
optimized for opacification of the pulmonary vasculature, there is
sufficient contrast enhancement to exclude pulmonary emboli. No
filling defects.

Mediastinum/Nodes: No enlarged mediastinal, hilar, or axillary lymph
nodes. Thyroid gland, trachea, and esophagus demonstrate no
significant findings.

Lungs/Pleura: No acute airspace disease, effusion, or pneumothorax.
Central airways are patent.

Musculoskeletal: No acute or destructive bony lesions. Multilevel
thoracic spondylosis. Reconstructed images demonstrate no additional
findings.

Review of the MIP images confirms the above findings.

CTA ABDOMEN AND PELVIS FINDINGS

VASCULAR

Aorta: Normal caliber aorta without aneurysm, dissection, vasculitis
or significant stenosis.

Celiac: Patent without evidence of aneurysm, dissection, vasculitis
or significant stenosis.

SMA: Patent without evidence of aneurysm, dissection, vasculitis or
significant stenosis.

Renals: Both renal arteries are patent without evidence of aneurysm,
dissection, vasculitis, fibromuscular dysplasia or significant
stenosis.

IMA: Patent without evidence of aneurysm, dissection, vasculitis or
significant stenosis.

Inflow: Patent without evidence of aneurysm, dissection, vasculitis
or significant stenosis.

Veins: No obvious venous abnormality within the limitations of this
arterial phase study.

Review of the MIP images confirms the above findings.

NON-VASCULAR

Hepatobiliary: No focal liver abnormality is seen. No gallstones,
gallbladder wall thickening, or biliary dilatation.

Pancreas: Unremarkable. No pancreatic ductal dilatation or
surrounding inflammatory changes.

Spleen: Normal in size without focal abnormality.

Adrenals/Urinary Tract: Adrenal glands are unremarkable. Kidneys are
normal, without renal calculi, focal lesion, or hydronephrosis.
Bladder is unremarkable.

Stomach/Bowel: No bowel obstruction or ileus. Normal appendix right
lower quadrant. No bowel wall thickening or inflammatory change.

Lymphatic: No pathologic adenopathy.

Reproductive: Prostate is unremarkable.

Other: No free fluid or free gas.  No abdominal wall hernia.

Musculoskeletal: No acute or destructive bony lesions. Multilevel
lumbar spondylosis and facet hypertrophy, greatest at the
lumbosacral junction. Reconstructed images demonstrate no additional
findings.

Review of the MIP images confirms the above findings.
IMPRESSION: 1. No evidence of thoracoabdominal aortic aneurysm or dissection.
2. No evidence of pulmonary embolus.
3. No acute intrathoracic, intra-abdominal, or intrapelvic process.

## 2023-04-07 ENCOUNTER — Other Ambulatory Visit: Payer: PRIVATE HEALTH INSURANCE

## 2023-04-08 ENCOUNTER — Ambulatory Visit (HOSPITAL_COMMUNITY): Payer: PRIVATE HEALTH INSURANCE | Attending: Internal Medicine

## 2023-04-08 DIAGNOSIS — R931 Abnormal findings on diagnostic imaging of heart and coronary circulation: Secondary | ICD-10-CM | POA: Insufficient documentation

## 2023-04-08 DIAGNOSIS — I10 Essential (primary) hypertension: Secondary | ICD-10-CM | POA: Insufficient documentation

## 2023-04-08 LAB — ECHOCARDIOGRAM COMPLETE
Area-P 1/2: 3.19 cm2
S' Lateral: 2.3 cm

## 2023-04-12 ENCOUNTER — Ambulatory Visit: Payer: PRIVATE HEALTH INSURANCE | Admitting: Cardiology

## 2023-04-21 ENCOUNTER — Encounter: Payer: Self-pay | Admitting: Cardiology

## 2023-04-21 ENCOUNTER — Ambulatory Visit: Payer: PRIVATE HEALTH INSURANCE | Attending: Cardiology | Admitting: Cardiology

## 2023-04-21 VITALS — BP 142/88 | HR 67 | Resp 16 | Ht 71.0 in | Wt 254.6 lb

## 2023-04-21 DIAGNOSIS — R7303 Prediabetes: Secondary | ICD-10-CM | POA: Diagnosis not present

## 2023-04-21 DIAGNOSIS — Z6834 Body mass index (BMI) 34.0-34.9, adult: Secondary | ICD-10-CM

## 2023-04-21 DIAGNOSIS — I10 Essential (primary) hypertension: Secondary | ICD-10-CM | POA: Diagnosis not present

## 2023-04-21 DIAGNOSIS — E782 Mixed hyperlipidemia: Secondary | ICD-10-CM

## 2023-04-21 DIAGNOSIS — E66811 Obesity, class 1: Secondary | ICD-10-CM

## 2023-04-21 DIAGNOSIS — R002 Palpitations: Secondary | ICD-10-CM | POA: Diagnosis not present

## 2023-04-21 DIAGNOSIS — G4730 Sleep apnea, unspecified: Secondary | ICD-10-CM

## 2023-04-21 DIAGNOSIS — E6609 Other obesity due to excess calories: Secondary | ICD-10-CM

## 2023-04-21 DIAGNOSIS — R931 Abnormal findings on diagnostic imaging of heart and coronary circulation: Secondary | ICD-10-CM | POA: Diagnosis not present

## 2023-04-21 NOTE — Patient Instructions (Signed)

## 2023-04-21 NOTE — Progress Notes (Signed)
Cardiology Office Note:  .   Date:  04/21/2023  ID:  Andre Salazar, DOB 07/08/1960, MRN [id number] PCP:  Andre Fick, MD  Former Cardiology Providers: None [facility] Providers Cardiologist:  Andre Lerner, DO , [facility] [place] [facility] (established care  02/15/2023) Electrophysiologist:  None  Click to update primary MD,subspecialty MD or APP then REFRESH:1}    Chief Complaint  Patient presents with   Palpitations   coronary artery calcification    Follow-up    History of Present Illness: .   Andre Salazar is a 62 y.o. African-American male whose past medical history and cardiovascular risk factors includes: Mild CAC, erectile dysfunction, hypertension, hyperlipidemia, prediabetes, nonalcoholic fatty liver disease, sleep apnea not on CPAP.   Patient was referred to the practice for coronary artery calcification and palpitations.  Palpitations: At the last office visit the shared decision was to proceed with Zio patch to evaluate for PVC burden. Since last office visit patient has not had any recurrence of palpitations. No significant PVC burden. He had asymptomatic episodes of NSVT and sinus pauses predominantly while he sleeps (midnight to 7:30 AM). He does have a history of sleep apnea but currently not on CPAP.  Reemphasized importance of having this readdressed.  Coronary calcification: Noted to have mild CAC placing him at the 29th percentile.  In a shared decision was to proceed with echo and exercise treadmill stress test.  Results reviewed with him in detail and noted below for further records.  Review of Systems: .   Review of Systems  Cardiovascular:  Negative for chest pain, claudication, dyspnea on exertion, irregular heartbeat, leg swelling, near-syncope, orthopnea, palpitations, paroxysmal nocturnal dyspnea and syncope.  Respiratory:  Negative for shortness of breath.   Hematologic/Lymphatic: Negative for bleeding problem.  Musculoskeletal:  Negative for  muscle cramps and myalgias.  Neurological:  Negative for dizziness and light-headedness.    Studies Reviewed:   EKG: February 15, 2023: Sinus rhythm, 72 bpm, rare PVCs, without underlying injury pattern.   Echocardiogram: 04/08/2023 1. Left ventricular ejection fraction, by estimation, is 60 to 65%. The left ventricle has normal function. The left ventricle has no regional wall motion abnormalities. There is mild left ventricular hypertrophy. Left ventricular diastolic parameters  are consistent with Grade I diastolic dysfunction (impaired relaxation). The average left ventricular global longitudinal strain is -19.1 %. The global longitudinal strain is normal. 2. Right ventricular systolic function is normal. The right ventricular size is normal. Tricuspid regurgitation signal is inadequate for assessing PA pressure. 3. The mitral valve is normal in structure. Trivial mitral valve regurgitation. No evidence of mitral stenosis. 4. The aortic valve is tricuspid. Aortic valve regurgitation is not visualized. No aortic stenosis is present. 5. The inferior vena cava is normal in size with greater than 50% respiratory variability, suggesting right atrial pressure of 3 mmHg.   Stress Testing: Treadmill Exercise Stress 03/05/2023: The patient exercised for 730 minutes and 0 seconds on Bruce protocol; achieved 9.38 METs at 100% of maximum predicted heart rate.  Chest pain is not present. Stress terminated due to [facility] achieved. Negative for ischemia by EKG in recovery. Baseline artifact during stress test.  No arrhythmias noted on ECG at stress.  The heart rate response was normal.  The blood pressure response was hypertensive. The baseline blood pressure was 152/98 mmHg and increased to 210/90 mmHg at peak exercise. Low risk.   Cardiac monitor (Zio Patch): 03/05/2023 - 03/12/2023 Dominant rhythm sinus. Heart rate 24-182 bpm. Avg HR 77 bpm. No atrial  fibrillation detected during the monitoring  period. No high grade AV block. 1 asymptomatic pause, 3.1 seconds, on 03/07/2023, 6:57 AM. 2 asymptomatic episodes of nocturnal NSVT. Longest and fast since episode was 4 beats in duration. Total supraventricular ectopic burden <1%. Total ventricular ectopic burden <1%. Patient triggered events: 0.   CT Cardiac Scoring: January 14, 2023 Left main 0. LAD 34.4. LCx 0. RCA 0. Total CAC 34.4 AU, 29th percentile for patient's age, sex, and race. Noncardiac findings: Punctate calcification in the aortic root.  No acute extracardiac abnormalities.  RADIOLOGY: N/A  Risk Assessment/Calculations:   N/A   Labs:    External Labs: Collected: December 21, 2022 provided by PCP. Hemoglobin 15.2 g/dL BUN 15, creatinine 4.74. Sodium 139, potassium 4.6, chloride 101, bicarb 24. AST 41, ALT 40, alkaline phosphatase 72. A1c 6.7. Total cholesterol 171, triglycerides 140, HDL 60, LDL calculated 87 TSH 3.0.   Physical Exam:    Today's Vitals   04/21/23 1310  BP: (!) 142/88  Pulse: 67  Resp: 16  SpO2: 97%  Weight: 254 lb 9.6 oz (115.5 kg)  Height: 5\' 11"  (1.803 m)   Body mass index is 35.51 kg/m. Wt Readings from Last 3 Encounters:  04/21/23 254 lb 9.6 oz (115.5 kg)  02/15/23 250 lb (113.4 kg)  07/23/21 220 lb (99.8 kg)    Physical Exam  Constitutional: No distress.  Age appropriate, hemodynamically stable.   Neck: No JVD present.  Cardiovascular: Normal rate, regular rhythm, S1 normal, S2 normal, intact distal pulses and normal pulses. Exam reveals no gallop, no S3 and no S4.  No murmur heard. Pulmonary/Chest: Effort normal and breath sounds normal. No stridor. He has no wheezes. He has no rales.  Abdominal: Soft. Bowel sounds are normal. He exhibits no distension. There is no abdominal tenderness.  Musculoskeletal:        General: No edema.     Cervical back: Neck supple.  Neurological: He is alert and oriented to person, place, and time. He has intact cranial nerves (2-12).  Skin:  Skin is warm and moist.     Impression & Recommendation(s):  Impression:   ICD-10-CM   1. Palpitations  R00.2     2. Agatston coronary artery calcium score less than 100  R93.1     3. Benign hypertension  I10     4. Prediabetes  R73.03     5. Mixed hyperlipidemia  E78.2     6. Sleep apnea in adult  G47.30     7. Class 1 obesity due to excess calories without serious comorbidity with body mass index (BMI) of 34.0 to 34.9 in adult  E66.811    E66.09    Z68.34        Recommendation(s):  Palpitations No recurrence of palpitations since last office visit. In the past patient was noted to have sinus rhythm with rare PVCs on EKG. His PVC burden was <1% during the recent Zio patch. He also had episodes of NSVT and 1 episode of sinus pause while he was asleep.  He usually goes to bed around midnight and wakes up at 7:30 PM. Reemphasized the importance of treating his sleep apnea.  Agatston coronary artery calcium score less than 100 Total CAC of 34.4, 29th percentile. Echo: Preserved LVEF, see report for additional details GXT: Low risk study Already on statin therapy. Recommend a goal LDL <70 mg/dL Patient will follow-up with PCP with regards to lipid management for now.  Benign hypertension Office blood pressures are not at  goal. Patient states at home blood pressures are usually around 120 mmHg on current medical therapy. I have asked him to keep a log of his blood pressures at home and to review with PCP to see if further medication titration is warranted  Mixed hyperlipidemia Currently on atorvastatin 40 mg p.o. nightly.   He denies myalgia or other side effects. Recommended goal LDL <70 mg/dL.    Sleep apnea in adult Reemphasized importance of device therapy. Patient states that he will follow-up with his sleep provider.  Class 1 obesity due to excess calories without serious comorbidity with body mass index (BMI) of 34.0 to 34.9 in adult Body mass index is 35.51  kg/m. I reviewed with him importance of diet, regular physical activity/exercise, weight loss.   Patient is educated on the importance of increasing physical activity gradually as tolerated with a goal of moderate intensity exercise for 30 minutes a day 5 days a week.  Orders Placed:  No orders of the defined types were placed in this encounter.  As part of medical decision making reviewed the results of the monitor, echocardiogram, and GXT with him independently as part of medical decision making.  Final Medication List:   No orders of the defined types were placed in this encounter.   There are no discontinued medications.   Current Outpatient Medications:    atorvastatin (LIPITOR) 40 MG tablet, Take 40 mg by mouth daily., Disp: , Rfl:    Multiple Vitamin (MULTIVITAMIN) tablet, Take 1 tablet by mouth daily., Disp: , Rfl:    sildenafil (VIAGRA) 100 MG tablet, Take 100 mg by mouth as needed for erectile dysfunction., Disp: , Rfl:    triamterene-hydrochlorothiazide (DYAZIDE) 37.5-25 MG capsule, Take 1 capsule by mouth daily., Disp: , Rfl:   Consent:   N/A  Disposition:   We discussed follow-up annually or as needed basis.  However given his age and risk factors he would like to follow-up annually.  He is advised to schedule his follow-up visit after his yearly physical to review test results/labs.  His questions and concerns were addressed to his satisfaction. He voices understanding of the recommendations provided during this encounter.    Signed, Andre Lerner, DO, [facility]  [facility]  [address] [place], [place] [address] 04/21/2023 1:30 PM

## 2023-05-05 ENCOUNTER — Ambulatory Visit: Payer: PRIVATE HEALTH INSURANCE

## 2023-05-05 ENCOUNTER — Encounter: Payer: Self-pay | Admitting: Internal Medicine

## 2023-05-05 ENCOUNTER — Ambulatory Visit (INDEPENDENT_AMBULATORY_CARE_PROVIDER_SITE_OTHER): Payer: PRIVATE HEALTH INSURANCE | Admitting: Internal Medicine

## 2023-05-05 VITALS — BP 124/82 | HR 74 | Temp 98.3°F | Ht 71.0 in | Wt 249.2 lb

## 2023-05-05 DIAGNOSIS — R058 Other specified cough: Secondary | ICD-10-CM

## 2023-05-05 MED ORDER — BENZONATATE 200 MG PO CAPS: 200.0000 mg | ORAL_CAPSULE | Freq: Three times a day (TID) | ORAL | 1 refills | Status: AC | PRN

## 2023-05-05 MED ORDER — METHYLPREDNISOLONE ACETATE 80 MG/ML IJ SUSP
120.0000 mg | Freq: Once | INTRAMUSCULAR | Status: AC
  Administered 2023-05-05: 120 mg via INTRAMUSCULAR

## 2023-05-05 MED ORDER — ACETAMINOPHEN-CODEINE 300-30 MG PO TABS: 1.0000 | ORAL_TABLET | ORAL | 0 refills | Status: AC | PRN

## 2023-05-05 MED ORDER — PANTOPRAZOLE SODIUM 40 MG PO TBEC: 40.0000 mg | DELAYED_RELEASE_TABLET | Freq: Every day | ORAL | 2 refills | Status: AC

## 2023-05-05 MED ORDER — FAMOTIDINE 20 MG PO TABS: ORAL_TABLET | ORAL | 11 refills | Status: AC

## 2023-05-05 NOTE — Assessment & Plan Note (Signed)
Onset p 1st covid dec 2020 and much worse p 3 rd covid 01/2023 rx paxlovid c/b L IH  - cyclical cough rx 05/05/2023 >>>  Upper airway cough syndrome (previously labeled PNDS),  is so named because it's frequently impossible to sort out how much is  CR/sinusitis with freq throat clearing (which can be related to primary GERD)   vs  causing  secondary (" extra esophageal")  GERD from wide swings in gastric pressure that occur with throat clearing, often  promoting self use of mint and menthol lozenges that reduce the lower esophageal sphincter tone and exacerbate the problem further in a cyclical fashion.   These are the same pts (now being labeled as having "irritable larynx syndrome" by some cough centers) who not infrequently have a history of having failed to tolerate ace inhibitors,  dry powder inhalers or biphosphonates or report having atypical/extraesophageal reflux symptoms eg globus that don't respond to standard doses of PPI  and are easily confused as having aecopd or asthma flares by even experienced allergists/ pulmonologists (myself included).   Of the three most common causes of  Sub-acute / recurrent or chronic cough, only one (GERD)  can actually contribute to/ trigger  the other two (asthma and post nasal drip syndrome)  and perpetuate the cylce of cough.  While not intuitively obvious, many patients with chronic low grade reflux do not cough until there is a primary insult that disturbs the protective epithelial barrier and exposes sensitive nerve endings.   This is typically viral but can due to PNDS and  either may apply here.   >>>   The point is that once this occurs, it is difficult to eliminate the cycle  using anything but a maximally effective acid suppression regimen at least in the short run, accompanied by an appropriate diet to address non acid GERD and control / eliminate the cough itself for at least 3 days with tessalon and tylenol #3  plus depomedrol 120 mg IM  in case of  component of Th-2 driven upper or lower airways inflammation (if cough responds short term only to relapse before return while will on full rx for uacs (as above), then  that would point to allergic rhinitis/ asthma or eos bronchitis as alternative dx)   F/u in  one week if not improved to his satisfaction          Each maintenance medication was reviewed in detail including emphasizing most importantly the difference between maintenance and prns and under what circumstances the prns are to be triggered using an action plan format where appropriate.  Total time for H and P, chart review, counseling,   and generating customized AVS unique to this office visit / same day charting = 45 min new pt eval

## 2023-05-05 NOTE — Progress Notes (Signed)
Andre Salazar, male    DOB: [birth year]   MRN: [id number]   Brief patient profile:  47  yobm  never played LB at [employer] works in security [employer] referred to pulmonary clinic 05/05/2023 by Nicholos Johns  for cough.  First covid 2020 bad cough which lingered plus fatigue then 2 more since last one 01/2023 rx paxlovid and 75% better x for the lingering cough > L IH / painful to cough .       History of Present Illness  05/05/2023  Pulmonary/ 1st office eval/Andre Salazar  Chief Complaint  Patient presents with   Consult    Severe cough  x 6 weeks.  Has had COVID during this time.  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    Also denies any obvious fluctuation of symptoms with weather or environmental changes or other aggravating or alleviating factors except as outlined above   No unusual exposure hx or h/o childhood pna/ asthma or knowledge of premature birth.  Current Allergies, Complete Past Medical History, Past Surgical History, Family History, and Social History were reviewed in [employer] record.  ROS  The following are not active complaints unless bolded Hoarseness, sore throat= globus , dysphagia, dental problems, itching, sneezing,  nasal congestion or discharge of excess mucus or purulent secretions, ear ache,   fever, chills, sweats, unintended wt loss or wt gain, classically pleuritic or exertional cp,  orthopnea pnd or arm/hand swelling  or leg swelling, presyncope, palpitations, abdominal pain, anorexia, nausea, vomiting, diarrhea  or change in  bowel habits or change in bladder habits, change in stools or change in urine, dysuria, hematuria,  rash, arthralgias, visual complaints, headache, numbness, weakness or ataxia or problems with walking or coordination,  change in mood or  memory.             Outpatient Medications Prior to Visit  Medication Sig Dispense Refill   atorvastatin (LIPITOR) 40 MG tablet Take 40 mg by mouth daily.     Cholecalciferol (VITAMIN D-1000 MAX ST) 25 MCG (1000 UT) tablet Take by mouth.     meloxicam (MOBIC) 15 MG tablet Take 15 mg by mouth daily.     Multiple Vitamin (MULTIVITAMIN) tablet Take 1 tablet by mouth daily.     sildenafil (VIAGRA) 100 MG tablet Take 100 mg by mouth as needed for erectile dysfunction.     triamterene-hydrochlorothiazide (DYAZIDE) 37.5-25 MG capsule Take 1 capsule by mouth daily.     No facility-administered medications prior to visit.    Past Medical History:  Diagnosis Date   Hyperlipidemia    Hypertension       Objective:     BP 124/82 (BP Location: Left Arm, Patient Position: Sitting, Cuff Size: Large)   Pulse 74   Temp 98.3 F (36.8 C) (Oral)   Ht 5\' 11"  (1.803 m)   Wt 249 lb 3.2 oz (113 kg)   SpO2 97%   BMI 34.76 kg/m   SpO2: 97 % RA   HEENT : Oropharynx  clear      Nasal  turbinates nl    NECK :  without  apparent JVD/ palpable Nodes/TM    LUNGS: no acc muscle use,  Nl contour chest which is clear to A and P bilaterally without cough on insp or exp maneuvers   CV:  RRR  no s3 or murmur or increase in P2, and no edema   ABD:  soft and nontender  - L IH not examined   MS:  Nl gait/ ext warm without deformities Or obvious joint restrictions  calf tenderness, cyanosis or clubbing    SKIN: warm and dry without lesions    NEURO:  alert, approp, nl sensorium with  no motor or cerebellar deficits apparent.      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    I personally reviewed images and agree with radiology impression as follows:   Chest CTcuts on coronary CT    01/14/23  wnl     Assessment   Upper airway cough syndrome Onset p 1st covid dec 2020 and much worse p 3 rd covid 01/2023 rx paxlovid c/b L IH  - cyclical cough rx 05/05/2023 >>>  Upper airway cough syndrome (previously labeled PNDS),  is so named because it's frequently impossible to sort out how much is  CR/sinusitis with freq throat clearing (which can be related to primary GERD)   vs  causing  secondary (" extra esophageal")  GERD from wide swings in gastric pressure that occur with throat clearing, often  promoting self use of mint and menthol lozenges that reduce the lower esophageal sphincter tone and exacerbate the problem further in a cyclical fashion.   These are the same pts (now being labeled as having "irritable larynx syndrome" by some cough centers) who not infrequently have a history of having failed to tolerate ace inhibitors,  dry powder inhalers or biphosphonates or report having atypical/extraesophageal reflux symptoms eg globus that don't respond to standard doses of PPI  and are easily confused as having aecopd or asthma flares by even experienced allergists/ pulmonologists (myself included).   Of the three most common causes of  Sub-acute / recurrent or chronic cough, only one (GERD)  can actually contribute to/ trigger  the other two (asthma and post nasal drip syndrome)  and perpetuate the cylce of cough.  While not intuitively obvious, many patients with chronic low grade reflux do not cough until there is a primary insult that disturbs the protective epithelial barrier and exposes sensitive nerve endings.   This is typically viral but can due to PNDS and  either may apply here.   >>>   The point is that once this occurs, it is difficult to eliminate the cycle  using anything but a maximally effective acid suppression regimen at least in  the short run, accompanied by an appropriate diet to address non acid GERD and control / eliminate the cough itself for at least 3 days with tessalon and tylenol #3  plus depomedrol 120 mg IM  in case of component of Th-2 driven upper or lower airways inflammation (if cough responds short term only to relapse before return while will on full rx for uacs (as above), then  that would point to allergic rhinitis/ asthma or eos bronchitis as alternative dx)   F/u in  one week if not improved to his satisfaction  Each maintenance medication was reviewed in detail including emphasizing most importantly the difference between maintenance and prns and under what circumstances the prns are to be triggered using an action plan format where appropriate.  Total time for H and P, chart review, counseling,   and generating customized AVS unique to this office visit / same day charting = 45 min new pt eval           Sandrea Hughs, MD 05/05/2023

## 2023-05-05 NOTE — Patient Instructions (Signed)
The key to effective treatment for your cough is eliminating the non-stop cycle of cough you're stuck in long enough to let your airway heal completely and then see if there is anything still making you cough once you stop the cough suppression, but this should take no more than 5 days to figure out  First take delsym two tsp every 12 hours and supplement if needed with   tessalon 200 and if not effective up every 4 hours to suppress the urge to cough at all or even clear your throat. Swallowing water or using ice chips/non mint and menthol containing candies (such as lifesavers or sugarless jolly ranchers) are also effective.  You should rest your voice and avoid activities that you know make you cough.  Once you have eliminated the cough for 3 straight days try reducing the tylenol #3  first,  then the delsym as tolerated.    Depomedrol 120 mg IM   Protonix (pantoprazole) Take 30-60 min before first meal of the day and Pepcid 20 mg one bedtime plus chlorpheniramine 4 mg x 2 at bedtime (both available over the counter)  until cough is completely gone for at least a week without the need for cough suppression  GERD (REFLUX)  is an extremely common cause of respiratory symptoms, many times with no significant heartburn at all.    It can be treated with medication, but also with lifestyle changes including avoidance of late meals, excessive alcohol, smoking cessation, and avoid fatty foods, chocolate, peppermint, colas, red wine, and acidic juices such as orange juice.  NO MINT OR MENTHOL PRODUCTS SO NO COUGH DROPS  USE HARD CANDY INSTEAD (jolley ranchers or Stover's or Lifesavers (all available in sugarless versions) NO OIL BASED VITAMINS - use powdered substitutes.  Please remember to go to the  x-ray department  for your tests - we will call you with the results when they are available    Call if not all better in a week

## 2023-10-26 ENCOUNTER — Ambulatory Visit (INDEPENDENT_AMBULATORY_CARE_PROVIDER_SITE_OTHER): Admitting: Podiatry

## 2023-10-26 ENCOUNTER — Encounter: Payer: Self-pay | Admitting: Podiatry

## 2023-10-26 ENCOUNTER — Ambulatory Visit (INDEPENDENT_AMBULATORY_CARE_PROVIDER_SITE_OTHER)

## 2023-10-26 DIAGNOSIS — M7662 Achilles tendinitis, left leg: Secondary | ICD-10-CM | POA: Diagnosis not present

## 2023-10-26 DIAGNOSIS — S86012A Strain of left Achilles tendon, initial encounter: Secondary | ICD-10-CM

## 2023-10-27 NOTE — Progress Notes (Signed)
 Subjective:  Patient ID: Andre Salazar, male    DOB: 1961/05/17,  MRN: [id number] HPI Chief Complaint  Patient presents with   Foot Pain    Achilles left - 5/10 pain, taking meloxicam, ongoing issue for 2 years   New Patient (Initial Visit)  He had similar symptoms to his right foot which had to ultimately be surgically fixed.  States that this has been going on now his left foot for the past 2 years.  Initially it was a come and go kind of thing now it has developed into a severe debilitating pain.  Currently he has tried staying off of it keeping it elevated icing it he has tried physical therapy steroid and nonsteroidal anti-inflammatories from his primary care provider ice therapy and different shoe gear.  63 y.o. male presents with the above complaint.   ROS: Denies fever chills nausea vomiting muscle aches pains calf pain back pain chest pain shortness of breath.  Past Medical History:  Diagnosis Date   Hyperlipidemia    Hypertension    Past Surgical History:  Procedure Laterality Date   COLONOSCOPY  2015   NASAL SINUS SURGERY  1980    Current Outpatient Medications:    atorvastatin  (LIPITOR) 40 MG tablet, Take 40 mg by mouth daily., Disp: , Rfl:    benzonatate  (TESSALON ) 200 MG capsule, Take 1 capsule (200 mg total) by mouth 3 (three) times daily as needed for cough., Disp: 30 capsule, Rfl: 1   Cholecalciferol (VITAMIN D-1000 [address] ST) 25 MCG (1000 UT) tablet, Take by mouth., Disp: , Rfl:    famotidine  (PEPCID ) 20 MG tablet, One after supper, Disp: 30 tablet, Rfl: 11   meloxicam (MOBIC) 15 MG tablet, Take 15 mg by mouth daily., Disp: , Rfl:    Multiple Vitamin (MULTIVITAMIN) tablet, Take 1 tablet by mouth daily., Disp: , Rfl:    pantoprazole  (PROTONIX ) 40 MG tablet, Take 1 tablet (40 mg total) by mouth daily. Take 30-60 min before first meal of the day, Disp: 30 tablet, Rfl: 2   sildenafil (VIAGRA) 100 MG tablet, Take 100 mg by mouth as needed for erectile dysfunction.,  Disp: , Rfl:    triamterene-hydrochlorothiazide (DYAZIDE) 37.5-25 MG capsule, Take 1 capsule by mouth daily., Disp: , Rfl:   Allergies  Allergen Reactions   Shellfish Allergy Swelling and Other (See Comments)    Eyes swell   Iodine Swelling and Other (See Comments)    Shellfish allergy and eyes swell   Review of Systems Objective:  There were no vitals filed for this visit.  General: Well developed, nourished, in no acute distress, alert and oriented x3   Dermatological: Skin is warm, dry and supple bilateral. Nails x 10 are well maintained; remaining integument appears unremarkable at this time. There are no open sores, no preulcerative lesions, no rash or signs of infection present.  Vascular: Dorsalis Pedis artery and Posterior Tibial artery pedal pulses are 2/4 bilateral with immedate capillary fill time. Pedal hair growth present. No varicosities and no lower extremity edema present bilateral.   Neruologic: Grossly intact via light touch bilateral. Vibratory intact via tuning fork bilateral. Protective threshold with Semmes Wienstein monofilament intact to all pedal sites bilateral. Patellar and Achilles deep tendon reflexes 2+ bilateral. No Babinski or clonus noted bilateral.   Musculoskeletal: No gross boney pedal deformities bilateral. No pain, crepitus, or limitation noted with foot and ankle range of motion bilateral. Muscular strength 5/5 in all groups tested bilateral.  Severe pain erythema and warmth on  palpation of the Achilles at the superior posterior attachment of the Achilles.  It does not appear to be avoided in the proximal Achilles though it is questionable distally and laterally.  Gait: Unassisted, Nonantalgic.    Radiographs:  Radiographs taken today demonstrate an osseously mature individual with severe thickening of the Achilles just superior to the posterior insertion site.  The Achilles is thickened so much that is actually laying on top of the calcaneus  posteriorly questioning a tear.  Retrocalcaneal heel spur is also noted with some fragmentation and intratendinous calcification.  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  Plan: At this point we will request an MRI for evaluation surgically and for differential diagnosis.  I imagine this will have to urgently go to the operating room.     Jermany Sundell T. [place], [place]

## 2023-11-12 ENCOUNTER — Ambulatory Visit
Admission: RE | Admit: 2023-11-12 | Discharge: 2023-11-12 | Disposition: A | Discharge status: Home / Self Care | Payer: PRIVATE HEALTH INSURANCE | Source: Ambulatory Visit | Attending: Podiatry | Admitting: Podiatry

## 2023-11-12 DIAGNOSIS — S86012A Strain of left Achilles tendon, initial encounter: Secondary | ICD-10-CM

## 2023-11-12 DIAGNOSIS — M25572 Pain in left ankle and joints of left foot: Secondary | ICD-10-CM | POA: Diagnosis not present

## 2023-11-12 DIAGNOSIS — S86312A Strain of muscle(s) and tendon(s) of peroneal muscle group at lower leg level, left leg, initial encounter: Secondary | ICD-10-CM | POA: Diagnosis not present

## 2023-11-12 DIAGNOSIS — M7662 Achilles tendinitis, left leg: Secondary | ICD-10-CM | POA: Diagnosis not present

## 2023-11-22 ENCOUNTER — Ambulatory Visit: Payer: Self-pay | Admitting: Podiatry
